# Patient Record
Sex: Female | Born: 1960 | Race: Black or African American | Hispanic: No | Marital: Single | Smoking: Never smoker
Health system: Southern US, Community
[De-identification: ages and names within clinical notes are randomized; demographics above are authoritative.]

## PROBLEM LIST (undated history)

## (undated) DIAGNOSIS — E119 Type 2 diabetes mellitus without complications: Secondary | ICD-10-CM

## (undated) HISTORY — PX: NO PAST SURGERIES: SHX2092

---

## 2018-09-07 DIAGNOSIS — E119 Type 2 diabetes mellitus without complications: Secondary | ICD-10-CM

## 2020-08-07 ENCOUNTER — Inpatient Hospital Stay (HOSPITAL_COMMUNITY)
Admission: EM | Admit: 2020-08-07 | Discharge: 2020-08-10 | DRG: 853 | Disposition: A | Discharge status: Home / Self Care | Payer: Self-pay | Attending: Internal Medicine | Admitting: Internal Medicine

## 2020-08-07 ENCOUNTER — Encounter (HOSPITAL_COMMUNITY): Payer: Self-pay | Admitting: Emergency Medicine

## 2020-08-07 ENCOUNTER — Ambulatory Visit (HOSPITAL_COMMUNITY)
Admission: RE | Admit: 2020-08-07 | Discharge: 2020-08-07 | Disposition: A | Discharge status: Home / Self Care | Payer: Self-pay | Source: Ambulatory Visit | Attending: Urgent Care | Admitting: Urgent Care

## 2020-08-07 ENCOUNTER — Other Ambulatory Visit: Payer: Self-pay

## 2020-08-07 ENCOUNTER — Other Ambulatory Visit: Payer: Self-pay | Admitting: Urgent Care

## 2020-08-07 ENCOUNTER — Other Ambulatory Visit (HOSPITAL_COMMUNITY): Payer: Self-pay | Admitting: Urgent Care

## 2020-08-07 ENCOUNTER — Emergency Department (HOSPITAL_COMMUNITY): Payer: Self-pay

## 2020-08-07 DIAGNOSIS — Z7984 Long term (current) use of oral hypoglycemic drugs: Secondary | ICD-10-CM

## 2020-08-07 DIAGNOSIS — K810 Acute cholecystitis: Secondary | ICD-10-CM | POA: Diagnosis present

## 2020-08-07 DIAGNOSIS — A409 Streptococcal sepsis, unspecified: Principal | ICD-10-CM | POA: Diagnosis present

## 2020-08-07 DIAGNOSIS — D649 Anemia, unspecified: Secondary | ICD-10-CM | POA: Diagnosis present

## 2020-08-07 DIAGNOSIS — Z5320 Procedure and treatment not carried out because of patient's decision for unspecified reasons: Secondary | ICD-10-CM

## 2020-08-07 DIAGNOSIS — E871 Hypo-osmolality and hyponatremia: Secondary | ICD-10-CM | POA: Diagnosis present

## 2020-08-07 DIAGNOSIS — E119 Type 2 diabetes mellitus without complications: Secondary | ICD-10-CM

## 2020-08-07 DIAGNOSIS — Z789 Other specified health status: Secondary | ICD-10-CM

## 2020-08-07 DIAGNOSIS — E785 Hyperlipidemia, unspecified: Secondary | ICD-10-CM

## 2020-08-07 DIAGNOSIS — K8000 Calculus of gallbladder with acute cholecystitis without obstruction: Secondary | ICD-10-CM

## 2020-08-07 DIAGNOSIS — K82A1 Gangrene of gallbladder in cholecystitis: Secondary | ICD-10-CM | POA: Diagnosis present

## 2020-08-07 DIAGNOSIS — Z20822 Contact with and (suspected) exposure to covid-19: Secondary | ICD-10-CM | POA: Diagnosis present

## 2020-08-07 DIAGNOSIS — K8012 Calculus of gallbladder with acute and chronic cholecystitis without obstruction: Secondary | ICD-10-CM | POA: Diagnosis present

## 2020-08-07 DIAGNOSIS — G9341 Metabolic encephalopathy: Secondary | ICD-10-CM | POA: Diagnosis present

## 2020-08-07 DIAGNOSIS — J9811 Atelectasis: Secondary | ICD-10-CM | POA: Diagnosis present

## 2020-08-07 DIAGNOSIS — E860 Dehydration: Secondary | ICD-10-CM | POA: Diagnosis present

## 2020-08-07 DIAGNOSIS — K819 Cholecystitis, unspecified: Secondary | ICD-10-CM

## 2020-08-07 DIAGNOSIS — R7989 Other specified abnormal findings of blood chemistry: Secondary | ICD-10-CM

## 2020-08-07 DIAGNOSIS — R1011 Right upper quadrant pain: Secondary | ICD-10-CM

## 2020-08-07 DIAGNOSIS — A419 Sepsis, unspecified organism: Secondary | ICD-10-CM

## 2020-08-07 DIAGNOSIS — K429 Umbilical hernia without obstruction or gangrene: Secondary | ICD-10-CM | POA: Diagnosis present

## 2020-08-07 DIAGNOSIS — D696 Thrombocytopenia, unspecified: Secondary | ICD-10-CM | POA: Diagnosis present

## 2020-08-07 DIAGNOSIS — R6520 Severe sepsis without septic shock: Secondary | ICD-10-CM | POA: Diagnosis present

## 2020-08-07 HISTORY — DX: Type 2 diabetes mellitus without complications: E11.9

## 2020-08-07 LAB — COMPREHENSIVE METABOLIC PANEL
ALT: 37 U/L (ref 0–44)
AST: 36 U/L (ref 15–41)
Albumin: 4 g/dL (ref 3.5–5.0)
Alkaline Phosphatase: 107 U/L (ref 38–126)
Anion gap: 12 (ref 5–15)
BUN: 21 mg/dL — ABNORMAL HIGH (ref 6–20)
CO2: 22 mmol/L (ref 22–32)
Calcium: 9.2 mg/dL (ref 8.9–10.3)
Chloride: 98 mmol/L (ref 98–111)
Creatinine, Ser: 0.86 mg/dL (ref 0.44–1.00)
GFR, Estimated: >60 mL/min (ref >60)
Glucose, Bld: 119 mg/dL — ABNORMAL HIGH (ref 70–99)
Potassium: 3.5 mmol/L (ref 3.5–5.1)
Sodium: 132 mmol/L — ABNORMAL LOW (ref 135–145)
Total Bilirubin: 2.8 mg/dL — ABNORMAL HIGH (ref 0.3–1.2)
Total Protein: 8.6 g/dL — ABNORMAL HIGH (ref 6.5–8.1)

## 2020-08-07 LAB — CBC
HCT: 39.9 % (ref 36.0–46.0)
Hemoglobin: 13.6 g/dL (ref 12.0–15.0)
MCH: 30.4 pg (ref 26.0–34.0)
MCHC: 34.1 g/dL (ref 30.0–36.0)
MCV: 89.1 fL (ref 80.0–100.0)
Platelets: 160 10*3/uL (ref 150–400)
RBC: 4.48 MIL/uL (ref 3.87–5.11)
RDW: 12.8 % (ref 11.5–15.5)
WBC: 16.6 10*3/uL — ABNORMAL HIGH (ref 4.0–10.5)
nRBC: 0 % (ref 0.0–0.2)

## 2020-08-07 LAB — CBG MONITORING, ED
Glucose-Capillary: 145 mg/dL — ABNORMAL HIGH (ref 70–99)
Glucose-Capillary: 94 mg/dL (ref 70–99)

## 2020-08-07 LAB — I-STAT BETA HCG BLOOD, ED (MC, WL, AP ONLY): I-stat hCG, quantitative: <5 m[IU]/mL (ref <5)

## 2020-08-07 LAB — RESP PANEL BY RT-PCR (FLU A&B, COVID) ARPGX2
Influenza A by PCR: NEGATIVE
Influenza B by PCR: NEGATIVE
SARS Coronavirus 2 by RT PCR: NEGATIVE

## 2020-08-07 LAB — PROCALCITONIN: Procalcitonin: 2.42 ng/mL

## 2020-08-07 LAB — CK: Total CK: 45 U/L (ref 38–234)

## 2020-08-07 LAB — PROTIME-INR
INR: 1.2 (ref 0.8–1.2)
Prothrombin Time: 14.3 seconds (ref 11.4–15.2)

## 2020-08-07 LAB — LIPASE, BLOOD: Lipase: 24 U/L (ref 11–51)

## 2020-08-07 LAB — APTT: aPTT: 23 seconds — ABNORMAL LOW (ref 24–36)

## 2020-08-07 LAB — MAGNESIUM: Magnesium: 2.3 mg/dL (ref 1.7–2.4)

## 2020-08-07 LAB — LACTIC ACID, PLASMA: Lactic Acid, Venous: 1.1 mmol/L (ref 0.5–1.9)

## 2020-08-07 MED ORDER — PIPERACILLIN-TAZOBACTAM 3.375 G IVPB: 3.3750 g | Freq: Three times a day (TID) | INTRAVENOUS | Status: DC

## 2020-08-07 MED ORDER — ONDANSETRON HCL 4 MG/2ML IJ SOLN: 4.0000 mg | Freq: Once | INTRAMUSCULAR | Status: DC

## 2020-08-07 MED ORDER — CHLORHEXIDINE GLUCONATE CLOTH 2 % EX PADS
6.0000 | MEDICATED_PAD | Freq: Once | CUTANEOUS | Status: AC
  Administered 2020-08-08: 6 via TOPICAL

## 2020-08-07 MED ORDER — ONDANSETRON HCL 4 MG/2ML IJ SOLN: 4.0000 mg | Freq: Once | INTRAMUSCULAR | Status: AC

## 2020-08-07 MED ORDER — GABAPENTIN 300 MG PO CAPS
300.0000 mg | ORAL_CAPSULE | ORAL | Status: AC
  Administered 2020-08-08: 300 mg via ORAL
  Filled 2020-08-07: qty 1

## 2020-08-07 MED ORDER — SODIUM CHLORIDE 0.9 % IV SOLN: 2.0000 g | Freq: Once | INTRAVENOUS | Status: DC

## 2020-08-07 MED ORDER — CELECOXIB 200 MG PO CAPS: 200.0000 mg | ORAL_CAPSULE | ORAL | Status: DC

## 2020-08-07 MED ORDER — BUPIVACAINE LIPOSOME 1.3 % IJ SUSP
20.0000 mL | Freq: Once | INTRAMUSCULAR | Status: DC
  Filled 2020-08-07: qty 20

## 2020-08-07 MED ORDER — FENTANYL CITRATE (PF) 100 MCG/2ML IJ SOLN: 50.0000 ug | Freq: Once | INTRAMUSCULAR | Status: DC | PRN

## 2020-08-07 MED ORDER — PIPERACILLIN-TAZOBACTAM 3.375 G IVPB
3.3750 g | Freq: Three times a day (TID) | INTRAVENOUS | Status: DC
  Administered 2020-08-08 (×2): 3.375 g via INTRAVENOUS
  Filled 2020-08-07 (×2): qty 50

## 2020-08-07 MED ORDER — LACTATED RINGERS IV BOLUS (SEPSIS)
500.0000 mL | Freq: Once | INTRAVENOUS | Status: AC
  Administered 2020-08-07: 500 mL via INTRAVENOUS

## 2020-08-07 MED ORDER — LACTATED RINGERS IV SOLN: INTRAVENOUS | Status: AC

## 2020-08-07 MED ORDER — LACTATED RINGERS IV BOLUS
1000.0000 mL | Freq: Once | INTRAVENOUS | Status: AC
  Administered 2020-08-07: 1000 mL via INTRAVENOUS

## 2020-08-07 MED ORDER — INSULIN ASPART 100 UNIT/ML ~~LOC~~ SOLN
0.0000 [IU] | SUBCUTANEOUS | Status: DC
  Administered 2020-08-08 (×2): 1 [IU] via SUBCUTANEOUS
  Administered 2020-08-08 (×2): 2 [IU] via SUBCUTANEOUS
  Administered 2020-08-09 (×2): 1 [IU] via SUBCUTANEOUS
  Filled 2020-08-07: qty 0.09

## 2020-08-07 MED ORDER — SODIUM CHLORIDE 0.9 % IV BOLUS
500.0000 mL | Freq: Once | INTRAVENOUS | Status: AC
  Administered 2020-08-07: 500 mL via INTRAVENOUS

## 2020-08-07 MED ORDER — CHLORHEXIDINE GLUCONATE CLOTH 2 % EX PADS: 6.0000 | MEDICATED_PAD | Freq: Once | CUTANEOUS | Status: DC

## 2020-08-07 MED ORDER — PIPERACILLIN-TAZOBACTAM 3.375 G IVPB 30 MIN
3.3750 g | Freq: Once | INTRAVENOUS | Status: AC
  Administered 2020-08-07: 3.375 g via INTRAVENOUS
  Filled 2020-08-07: qty 50

## 2020-08-07 MED ORDER — ACETAMINOPHEN 500 MG PO TABS
1000.0000 mg | ORAL_TABLET | ORAL | Status: AC
  Filled 2020-08-07: qty 2

## 2020-08-07 MED ORDER — ONDANSETRON HCL 4 MG/2ML IJ SOLN
INTRAMUSCULAR | Status: AC
  Administered 2020-08-07: 4 mg via INTRAVENOUS
  Filled 2020-08-07: qty 2

## 2020-08-07 MED ORDER — ACETAMINOPHEN 325 MG PO TABS
650.0000 mg | ORAL_TABLET | Freq: Once | ORAL | Status: AC | PRN
  Administered 2020-08-07: 650 mg via ORAL
  Filled 2020-08-07: qty 2

## 2020-08-07 NOTE — Consult Note (Addendum)
Bethany Diaz  November 14, 1960 [id number]  CARE TEAM:  PCP: Patient, No Pcp Per  Outpatient Care Team: Patient Care Team: Patient, No Pcp Per as PCP - General (General Practice)  Inpatient Treatment Team: Treatment Team: Attending Provider: Geoffery Lyons, MD; Technician: Ranell Patrick, NT; Registered Nurse: Gasper Lloyd, RN; Physician Assistant: Norman Clay; Consulting Physician: Montez Morita, Md, MD   This patient is a 59 y.o.female who presents today for surgical evaluation at the request of Marlaine Hind, [place] &  Dr Judd Lien. [facility]  Chief complaint / Reason for evaluation: Gallbladder attack, probable cholecystitis  Diabetic woman.  Does not speak [place] - prefers [place].  I believe she was originally from [place] and then in [place].  Had worsening abdominal pain and discomfort.  Discussed with her primary care office.  They recommended ultrasound and labs.  Elevated liver function tests and ultrasound suspicious for cholecystitis.  Sounds like she was told to go to the emergency room.  Surgical consultation recommended.  It was a challenge to find an appropriate translator since she speaks [place].  However after the fourth attempt, we got an interpreter who seemed to be able to discuss with the patient well and the patient felt comfortable communicating with.  Patient claims pain in the right upper side lasting for several days.  She tries to avoid fatty or sweet foods and she does have diabetes.  It has been recommended that she be on Metformin.  I do not know if she is taking it but she notes she does have the prescription.  She has a primary care physician who she last saw about 2 years ago.  I do not think she has had 6 months follow-ups as had been recommended.  Patient denies any history abdominal surgery.  No cardiac or pulmonary issues.  No history of heart attack or stroke.  Claims to move her bowels about every day or so.  No liver problems.  No heavy  drinking.  No pancreas problems.  No history of ulcers or heartburn or reflux issues.  Patient notes the pain became much more intense.  Sounds like she has had some nausea but no definite emesis.  Appetite is way down.  Ultrasound done concerning for cholecystitis.  No personal nor family history of GI/colon cancer, inflammatory bowel disease, irritable bowel syndrome, allergy such as Celiac Sprue, dietary/dairy problems, colitis, ulcers nor gastritis.  Denies any history of liver problems nor hepatitis.  No recent sick contacts/gastroenteritis.  No travel outside the country.  No changes in diet.  No dysphagia to solids or liquids.  No significant heartburn or reflux.  No hematochezia, hematemesis, coffee ground emesis.  No evidence of prior gastric/peptic ulceration.  Patient does not want to consider any surgery.   Assessment  Bethany Diaz  59 y.o. female       Problem List:  Principal Problem:   Acute calculous cholecystitis Active Problems:   Type 2 diabetes mellitus without complication, without long-term current use of insulin (HCC)   Non-English speaking patient ([place] preferred)   Acute cholecystitis by history physical and ultrasound exam.  Gallbladder wall thickening and Murphy sign.  Elevated bilirubin liver function test.  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    Plan:  I explained through the interpreter that she had a rather classic picture of cholecystitis.  I recommended hospitalization, IV antibiotics, laparoscopic cholecystectomy in the morning.  We worked to try and explain the pathophysiology  and reasoning with the interpreter using lay language.  I explained many different ways.  Spent over 20 minutes discussing with her.  Patient stated that she did not want to be admitted nor did she want to consider any surgery.  She felt she just needed to get antibiotics through her primary care office and go home.  I tried to discourage her from  this.  She said she did not want to have surgery, and I should call her son.  I d/w Dr. Judd Lien, emergency room physician that I was going to discuss with the son since the patient is refusing surgery.  I called her son, Randel Books, [phone].  I explained the diagnosis and my recommendations.  He had questions about risks of bleeding and surgical technique.  I explained a laparoscopic approach.  I noted the risk of major bleeding or need for transfusion should be rather low.  I explained to both of them that there are risks of surgery but I think the risks in her relatively low in the risk of doing antibiotic only uses failure rate and imminent need for cholecystectomy anyway.  Patient's son will try to come to the hospital and discuss with his mother again.  I called and discussed with the covering [facility] ED physician assistant, Marlaine Hind.  Explained the reasoning for hospitalization cholecystectomy.  I explained the natural history risk of going against medical/surgical advice with increasing concerns for progressive infection, gangrene/empyema, cholangitis, nausea vomiting dehydration, and perhaps even rupture and biliary peritonitis.  My concern of delay is that it gets much more difficult to do cholecystectomy surgically, requiring percutaneous duration or conversion to open.  Bile duct injury risks go way up as well the more we delay her if she gets repeated problems.  I do not think these risks are imminent tonight.  However, I am rather skeptical that antibiotics only will solve this problem, just perhaps delay it a little bit.  The need for surgery is imminent.  Because the patient is refusing my surgical advice, surgery will follow peripherally.  Should the patient be able to convinced to be admitted, medicine can reconsult surgery in the morning to see if the patient is more willing to reconsider surgery.  I would recommend IV Zosyn since it looks like she has significant cholecystitis  with leukocytosis.  That has already started.  Should she be admitted, I would recommend follow-up LFTs in the morning.  If they are significantly increased may need to involve gastroenterology with MRCP/ERCP to rule out common bile duct stones.  If not more significantly elevated, perhaps proceed with cholecystectomy first if patient reconsiders.  She does not appear to be hyperglycemic, but I am concerned that she is not too compliant on her diabetic medicine.  Check hemoglobin A1c if she stays.  See what medicine thinks.  -VTE prophylaxis- SCDs, etc -mobilize as tolerated to help recovery  55 minutes spent in review, evaluation, examination, counseling, and coordination of care.  More than 50% of that time was spent in counseling.  Ardeth Sportsman, MD, FACS, MASCRS Gastrointestinal and Minimally Invasive Surgery  [facility] Surgery [address] [place], [place] [address] [phone] Fax [phone] Main/Paging  CONTACT INFORMATION: Weekday (9AM-5PM) concerns: Call [facility] main office at [phone] Weeknight (5PM-9AM) or Weekend/Holiday concerns: Check [email or web address] for General Surgery [facility] coverage (Please, do not use SecureChat as it is not reliable communication to operating surgeons for immediate patient care)      08/07/2020  Past Medical History:  Diagnosis Date  . Diabetes mellitus without complication [facility])     Past Surgical History:  Procedure Laterality Date  . NO PAST SURGERIES      Social History   Socioeconomic History  . Marital status: Single    Spouse name: Not on file  . Number of children: Not on file  . Years of education: Not on file  . Highest education level: Not on file  Occupational History  . Not on file  Tobacco Use  . Smoking status: Never Smoker  Substance and Sexual Activity  . Alcohol use: Never  . Drug use: Never  . Sexual activity: Not on file  Other Topics Concern  . Not on file  Social  History Narrative  . Not on file   Social Determinants of Health   Financial Resource Strain:   . Difficulty of Paying Living Expenses: Not on file  Food Insecurity:   . Worried About Programme researcher, broadcasting/film/video in the Last Year: Not on file  . Ran Out of Food in the Last Year: Not on file  Transportation Needs:   . Lack of Transportation (Medical): Not on file  . Lack of Transportation (Non-Medical): Not on file  Physical Activity:   . Days of Exercise per Week: Not on file  . Minutes of Exercise per Session: Not on file  Stress:   . Feeling of Stress : Not on file  Social Connections:   . Frequency of Communication with Friends and Family: Not on file  . Frequency of Social Gatherings with Friends and Family: Not on file  . Attends Religious Services: Not on file  . Active Member of Clubs or Organizations: Not on file  . Attends Banker Meetings: Not on file  . Marital Status: Not on file  Intimate Partner Violence:   . Fear of Current or Ex-Partner: Not on file  . Emotionally Abused: Not on file  . Physically Abused: Not on file  . Sexually Abused: Not on file    No family history on file.  Current Facility-Administered Medications  Medication Dose Route Frequency Provider Last Rate Last Admin  . fentaNYL (SUBLIMAZE) injection 50 mcg  50 mcg Intravenous Once PRN Cristina Gong, PA-C      . lactated ringers infusion   Intravenous Continuous Cristina Gong, PA-C 150 mL/hr at 08/07/20 1830 New Bag at 08/07/20 1830  . ondansetron (ZOFRAN) injection 4 mg  4 mg Intravenous Once Cristina Gong, PA-C       No current outpatient medications on file.     No Known Allergies  ROS:   All other systems reviewed & are negative except per HPI or as noted below: Constitutional:  No fevers, chills, sweats.  Weight stable Eyes:  No vision changes, No discharge HENT:  No sore throats, nasal drainage Lymph: No neck swelling, No bruising easily Pulmonary:  No  cough, productive sputum CV: No orthopnea, PND  Patient walks 30 minutes for about 1 miles without difficulty.  No exertional chest/neck/shoulder/arm pain. GI:  No personal nor family history of GI/colon cancer, inflammatory bowel disease, irritable bowel syndrome, allergy such as Celiac Sprue, dietary/dairy problems, colitis, ulcers nor gastritis.  No recent sick contacts/gastroenteritis.  No travel outside the country.  No changes in diet. Renal: No UTIs, No hematuria Genital:  No drainage, bleeding, masses Musculoskeletal: No severe joint pain.  Good ROM major joints Skin:  No sores or lesions.  No rashes Heme/Lymph:  No  easy bleeding.  No swollen lymph nodes Neuro: No focal weakness/numbness.  No seizures Psych: No suicidal ideation.  No hallucinations  BP (!) 141/108   Pulse 88   Temp 99.1 F (37.3 C) (Oral)   Resp (!) 26   SpO2 93%   Physical Exam: Constitutional: Not cachectic.  Hygeine adequate.  Vitals signs as above.  Patient not toxic nor sickly.  Somewhat fidgety in bed but not agitated nor psychotic Eyes: Pupils reactive, normal extraocular movements. Sclera nonicteric Neuro: CN II-XII intact.  No major focal sensory defects.  No major motor deficits. Lymph: No head/neck/groin lymphadenopathy Psych:  No severe agitation.  No severe anxiety.  Judgment & insight Adequate, Oriented x4, HENT: Normocephalic, Mucus membranes moist.  No thrush.   Neck: Supple, No tracheal deviation.  No obvious thyromegaly Chest: No pain to chest wall compression.  Good respiratory excursion.  No audible wheezing CV:  Pulses intact.  Regular rhythm.  No major extremity edema Abdomen:  Soft.  Mildly distended.  Tenderness at Right upper quadrant subcostal ridge only with Murphy sign.  The rest of the abdomen is nontender.. No incarcerated hernias.  No hepatomegaly.  No splenomegaly Gen:  No inguinal hernias.  No inguinal lymphadenopathy.   Ext: No obvious deformity or contracture no significant  edema.  No cyanosis Skin: No major subcutaneous nodules.  Warm and dry Musculoskeletal: Severe joint rigidity not present.  No obvious clubbing.  No digital petechiae.     Results:   Labs: Results for orders placed or performed during the hospital encounter of 08/07/20 (from the past 48 hour(s))  Lipase, blood     Status: None   Collection Time: 08/07/20  4:07 PM  Result Value Ref Range   Lipase 24 11 - 51 U/L    Comment: Performed at [facility], [address], [place], [place] [address]  Comprehensive metabolic panel     Status: Abnormal   Collection Time: 08/07/20  4:07 PM  Result Value Ref Range   Sodium 132 (L) 135 - 145 mmol/L   Potassium 3.5 3.5 - 5.1 mmol/L   Chloride 98 98 - 111 mmol/L   CO2 22 22 - 32 mmol/L   Glucose, Bld 119 (H) 70 - 99 mg/dL    Comment: Glucose reference range applies only to samples taken after fasting for at least 8 hours.   BUN 21 (H) 6 - 20 mg/dL   Creatinine, Ser 8.92 0.44 - 1.00 mg/dL   Calcium 9.2 8.9 - 11.9 mg/dL   Total Protein 8.6 (H) 6.5 - 8.1 g/dL   Albumin 4.0 3.5 - 5.0 g/dL   AST 36 15 - 41 U/L   ALT 37 0 - 44 U/L   Alkaline Phosphatase 107 38 - 126 U/L   Total Bilirubin 2.8 (H) 0.3 - 1.2 mg/dL   GFR, Estimated >41 >74 mL/min    Comment: (NOTE) Calculated using the CKD-EPI Creatinine Equation (2021)    Anion gap 12 5 - 15    Comment: Performed at [facility], [address] [place], [place] [address]  CBC     Status: Abnormal   Collection Time: 08/07/20  4:07 PM  Result Value Ref Range   WBC 16.6 (H) 4.0 - 10.5 K/uL   RBC 4.48 3.87 - 5.11 MIL/uL   Hemoglobin 13.6 12.0 - 15.0 g/dL   HCT 81.8 36 - 46 %   MCV 89.1 80.0 - 100.0 fL   MCH 30.4 26.0 - 34.0 pg  MCHC 34.1 30.0 - 36.0 g/dL   RDW 16.112.8 09.611.5 - 04.515.5 %   Platelets 160 150 - 400 K/uL   nRBC 0.0 0.0 - 0.2 %    Comment: Performed at [facility], [address] [place], [place] [address]  Lactic acid, plasma      Status: None   Collection Time: 08/07/20  4:31 PM  Result Value Ref Range   Lactic Acid, Venous 1.1 0.5 - 1.9 mmol/L    Comment: Performed at [facility], [address] [place], [place] [address]  Protime-INR     Status: None   Collection Time: 08/07/20  4:45 PM  Result Value Ref Range   Prothrombin Time 14.3 11.4 - 15.2 seconds   INR 1.2 0.8 - 1.2    Comment: (NOTE) INR goal varies based on device and disease states. Performed at [facility], [address] [place], [place] [address]   APTT     Status: Abnormal   Collection Time: 08/07/20  4:45 PM  Result Value Ref Range   aPTT 23 (L) 24 - 36 seconds    Comment: Performed at [facility], [address] [place], [place] [address]  Resp Panel by RT-PCR (Flu A&B, Covid) Nasopharyngeal Swab     Status: None   Collection Time: 08/07/20  4:47 PM   Specimen: Nasopharyngeal Swab; Nasopharyngeal(NP) swabs in vial transport medium  Result Value Ref Range   SARS Coronavirus 2 by RT PCR NEGATIVE NEGATIVE    Comment: (NOTE) SARS-CoV-2 target nucleic acids are NOT DETECTED.  The SARS-CoV-2 RNA is generally detectable in upper respiratory specimens during the acute phase of infection. The lowest concentration of SARS-CoV-2 viral copies this assay can detect is 138 copies/mL. A negative result does not preclude SARS-Cov-2 infection and should not be used as the sole basis for treatment or other patient management decisions. A negative result may occur with  improper specimen collection/handling, submission of specimen other than nasopharyngeal swab, presence of viral mutation(s) within the areas targeted by this assay, and inadequate number of viral copies(<138 copies/mL). A negative result must be combined with clinical observations, patient history, and epidemiological information. The expected result is Negative.  Fact Sheet for Patients:   BloggerCourse.comhttps://www.fda.gov/media/152166/download  Fact Sheet for Healthcare Providers:  SeriousBroker.ithttps://www.fda.gov/media/152162/download  This test is no t yet approved or cleared by the [place] FDA and  has been authorized for detection and/or diagnosis of SARS-CoV-2 by FDA under an Emergency Use Authorization (EUA). This EUA will remain  in effect (meaning this test can be used) for the duration of the COVID-19 declaration under Section 564(b)(1) of the Act, 21 U.S.C.section 360bbb-3(b)(1), unless the authorization is terminated  or revoked sooner.       Influenza A by PCR NEGATIVE NEGATIVE   Influenza B by PCR NEGATIVE NEGATIVE    Comment: (NOTE) The Xpert Xpress SARS-CoV-2/FLU/RSV plus assay is intended as an aid in the diagnosis of influenza from Nasopharyngeal swab specimens and should not be used as a sole basis for treatment. Nasal washings and aspirates are unacceptable for Xpert Xpress SARS-CoV-2/FLU/RSV testing.  Fact Sheet for Patients: BloggerCourse.comhttps://www.fda.gov/media/152166/download  Fact Sheet for Healthcare Providers: SeriousBroker.ithttps://www.fda.gov/media/152162/download  This test is not yet approved or cleared by the [place] FDA and has been authorized for detection and/or diagnosis of SARS-CoV-2 by FDA under an Emergency Use Authorization (EUA). This EUA will remain in effect (meaning this test can be used) for the duration of the COVID-19 declaration  under Section 564(b)(1) of the Act, 21 U.S.C. section 360bbb-3(b)(1), unless the authorization is terminated or revoked.  Performed at [facility], [address], [place], [place] [address]   I-Stat beta hCG blood, ED     Status: None   Collection Time: 08/07/20  5:06 PM  Result Value Ref Range   I-stat hCG, quantitative <5.0 <5 mIU/mL   Comment 3            Comment:   GEST. AGE      CONC.  (mIU/mL)   <=1 WEEK        5 - 50     2 WEEKS       50 - 500     3 WEEKS       100 - 10,000     4 WEEKS      1,000 - 30,000        FEMALE AND NON-PREGNANT FEMALE:     LESS THAN 5 mIU/mL     Imaging / Studies: DG Chest Port 1 View  Result Date: 08/07/2020 CLINICAL DATA:  Questionable sepsis, evaluate for abnormality. EXAM: PORTABLE CHEST 1 VIEW COMPARISON:  None. FINDINGS: Borderline enlargement the cardiac silhouette. Linear opacity at the lateral right lung base. Otherwise, no consolidation. No visible pleural effusions or pneumothorax. No acute osseous abnormality. IMPRESSION: Linear opacity at the lateral right lung base, favor atelectasis. Electronically Signed   By: Feliberto Harts MD   On: 08/07/2020 18:36   US Abdomen Limited RUQ (LIVER/GB)  Result Date: 08/07/2020 CLINICAL DATA:  Right upper quadrant abdominal pain. EXAM: ULTRASOUND ABDOMEN LIMITED RIGHT UPPER QUADRANT COMPARISON:  None. FINDINGS: Gallbladder: Gallbladder wall thickening. Positive Murphy sign per the technologist. Multiple gallstones, measuring up to 1.2 cm. There may be a small amount of pericholecystic fluid. Common bile duct: Diameter: 2.7 mm, within normal limits. Liver: No focal lesion identified. Within normal limits in parenchymal echogenicity. Portal vein is patent on color Doppler imaging with normal direction of blood flow towards the liver. IMPRESSION: Findings concerning for acute cholecystitis, including gallbladder wall thickening, gallstones, and positive Murphy sign. These results will be called to the ordering clinician or representative by the Radiologist Assistant, and communication documented in the PACS or [employer]. Electronically Signed   By: Feliberto Harts MD   On: 08/07/2020 15:39    Medications / Allergies: per chart  Antibiotics: Anti-infectives (From admission, onward)   Start     Dose/Rate Route Frequency Ordered Stop   08/07/20 1715  piperacillin-tazobactam (ZOSYN) IVPB 3.375 g        3.375 g 100 mL/hr over 30 Minutes Intravenous  Once 08/07/20 1705 08/07/20 1750   08/07/20 1700   cefTRIAXone (ROCEPHIN) 2 g in sodium chloride 0.9 % 100 mL IVPB  Status:  Discontinued        2 g 200 mL/hr over 30 Minutes Intravenous  Once 08/07/20 1647 08/07/20 1705        Note: Portions of this report may have been transcribed using voice recognition software. Every effort was made to ensure accuracy; however, inadvertent computerized transcription errors may be present.   Any transcriptional errors that result from this process are unintentional.    Ardeth Sportsman, MD, FACS, MASCRS Gastrointestinal and Minimally Invasive Surgery  [facility] Surgery [address] [place], [place] [address] [phone] Fax [phone] Main/Paging  CONTACT INFORMATION: Weekday (9AM-5PM) concerns: Call [facility] main office at [phone] Weeknight (5PM-9AM) or Weekend/Holiday concerns: Check [email or web address] for  General Surgery [facility] coverage (Please, do not use SecureChat as it is not reliable communication to operating surgeons for immediate patient care)      08/07/2020  6:49 PM

## 2020-08-07 NOTE — ED Triage Notes (Signed)
Pt presents from PCP after having R sided upper  abdominal pain. Pt was sent to our US imaging here in the hospital and now has been diagnosed with acute cholecystitis. Alert and oriented. Speaks french creole/haitian creole.   Son, Fayrene Fearing, wants to be called with any information. [phone]

## 2020-08-07 NOTE — Progress Notes (Signed)
A consult was received from an ED physician for zosyn  per pharmacy dosing.  The patient's profile has been reviewed for ht/wt/allergies/indication/available labs.    A one time order has been placed for zosyn 3.375 gm IV infuse over 30 min x1.  Further antibiotics/pharmacy consults should be ordered by admitting physician if indicated.                       Thank you, Lucia Gaskins 08/07/2020  4:53 PM

## 2020-08-07 NOTE — H&P (Signed)
[name] XLK:[id number] DOB: October 30, 1960 DOA: 08/07/2020    PCP: Patient, No Pcp Per   Outpatient Specialists:  NONE    Patient arrived to ER on 08/07/20 at 1513 Referred by Attending Veryl Speak, MD   Patient coming from: home Lives  With family    Chief Complaint:   Chief Complaint  Patient presents with  . Abdominal Pain    HPI: Bethany Diaz is a 59 y.o. female with medical history significant of  DM2, HLD    Presented with   Abdominal pain for the past 2 days unable to eat Nausea and vomiting and fever.  She takes metformin for DM not on any insulin Also report tooth ache that has been for the past 5 days  never smoker does not drink At baseline able to walk a flight of stairs Has no exertional chest pain or chest pain otherwise no dyspnea on exertion  No known hx of CAD Infectious risk factors:  Reports  fever,  N/V/Diarrhea/abdominal pain,     Has NOt been vaccinated against COVID    Initial COVID TEST  NEGATIVE   Lab Results  Component Value Date   [place] 08/07/2020      Regarding pertinent Chronic problems    DM 2 - No results found for: HGBA1C  on   PO meds only    While in ER: US showing cholecystitis Patient is [place] speaking only Son arrives and was able to help with translation ER provider was obtained history using tele translator Given evidence of cholecystitis patient started on Zosyn  ER Provider Called: general Surgery    Dr. Johney Maine  Initially patient refused cholecystectomy and wanted to be discharged to home After extensive discussion with patient and the family she agreed to stay and have surgical procedure done in the morning General surgery is aware  seen in ER originally Plan to see in a.m. again Please see Dr. Johney Maine note  Hospitalist was called for admission for Cholecystitis resulting in sepsis  The following Work up has been ordered so far:  Orders Placed This Encounter  Procedures  .  Critical Care  . Culture, blood (routine x 2)  . Urine culture  . Resp Panel by RT-PCR (Flu A&B, Covid) Nasopharyngeal Swab  . DG Chest Port 1 View  . Lipase, blood  . Comprehensive metabolic panel  . CBC  . Urinalysis, Routine w reflex microscopic  . Lactic acid, plasma  . Protime-INR  . APTT  . Hemoglobin A1c  . Creatinine, serum  . Potassium  . Hepatic function panel  . Lipase, blood  . CBC  . Diet NPO time specified  . Cardiac monitoring  . Document height and weight  . Assess and Document Glasgow Coma Scale  . Document vital signs within 1-hour of fluid bolus completion. Notify provider of abnormal vital signs despite fluid resuscitation.  . DO NOT delay antibiotics if unable to obtain blood culture.  . Refer to Sidebar Report: Sepsis Sidebar ED/IP  . Notify provider for difficulties obtaining IV access.  . Insert peripheral IV x 2  . Initiate Carrier Fluid Protocol  . Height and weight  . Check Rectal Temperature  . Code Sepsis activation.  This occurs automatically when order is signed and prioritizes pharmacy, lab, and radiology services for STAT collections and interventions.  If [facility] downtime, call [facility] [phone]) to activate Code Sepsis.  . Consult to general surgery  ALL PATIENTS BEING ADMITTED/HAVING PROCEDURES NEED COVID-19 SCREENING  .  Consult to hospitalist  ALL PATIENTS BEING ADMITTED/HAVING PROCEDURES NEED COVID-19 SCREENING  . Pulse oximetry, continuous  . I-Stat beta hCG blood, ED  . CBG monitoring, ED  . ED EKG     Following Medications were ordered in ER: Medications  lactated ringers infusion ( Intravenous New Bag/Given 08/07/20 1830)  ondansetron (ZOFRAN) injection 4 mg (0 mg Intravenous Hold 08/07/20 1752)  fentaNYL (SUBLIMAZE) injection 50 mcg (has no administration in time range)  sodium chloride 0.9 % bolus 500 mL (has no administration in time range)  acetaminophen (TYLENOL) tablet 650 mg (650 mg Oral Given 08/07/20 1613)  lactated  ringers bolus 500 mL (0 mLs Intravenous Stopped 08/07/20 1830)  piperacillin-tazobactam (ZOSYN) IVPB 3.375 g ( Intravenous Stopped 08/07/20 1750)  ondansetron (ZOFRAN) injection 4 mg (4 mg Intravenous Given 08/07/20 1735)  lactated ringers bolus 1,000 mL (1,000 mLs Intravenous New Bag/Given 08/07/20 2023)        Consult Orders  (From admission, onward)         Start     Ordered   08/07/20 2038  Consult to hospitalist  ALL PATIENTS BEING ADMITTED/HAVING PROCEDURES NEED COVID-19 SCREENING  Once       Comments: ALL PATIENTS BEING ADMITTED/HAVING PROCEDURES NEED COVID-19 SCREENING  Provider:  (Not yet assigned)  Question Answer Comment  Place call to: [facility]   Reason for Consult Admit      08/07/20 2037          Significant initial  Findings: Abnormal Labs Reviewed  COMPREHENSIVE METABOLIC PANEL - Abnormal; Notable for the following components:      Result Value   Sodium 132 (*)    Glucose, Bld 119 (*)    BUN 21 (*)    Total Protein 8.6 (*)    Total Bilirubin 2.8 (*)    All other components within normal limits  CBC - Abnormal; Notable for the following components:   WBC 16.6 (*)    All other components within normal limits  APTT - Abnormal; Notable for the following components:   aPTT 23 (*)    All other components within normal limits   Otherwise labs showing:    Recent Labs  Lab 08/07/20 1607  NA 132*  K 3.5  CO2 22  GLUCOSE 119*  BUN 21*  CREATININE 0.86  CALCIUM 9.2    Cr  stable,    Lab Results  Component Value Date   CREATININE 0.86 08/07/2020    Recent Labs  Lab 08/07/20 1607  AST 36  ALT 37  ALKPHOS 107  BILITOT 2.8*  PROT 8.6*  ALBUMIN 4.0   Lab Results  Component Value Date   CALCIUM 9.2 08/07/2020   WBC      Component Value Date/Time   WBC 16.6 (H) 08/07/2020 1607    Plt: Lab Results  Component Value Date   PLT 160 08/07/2020    Lactic Acid, Venous    Component Value Date/Time   LATICACIDVEN 1.1 08/07/2020 1631      Procalcitonin 2.42    HG/HCT stable,     Component Value Date/Time   HGB 13.6 08/07/2020 1607   HCT 39.9 08/07/2020 1607   MCV 89.1 08/07/2020 1607    Recent Labs  Lab 08/07/20 1607  LIPASE 24   No results for input(s): AMMONIA in the last 168 hours.      Cardiac Panel (last 3 results) Recent Labs    08/07/20 1607  CKTOTAL 45       ECG: Ordered Personally  reviewed by me showing: HR : 96 Rhythm:  NSR   no evidence of ischemic changes QTC 426   DM  labs:  HbA1C: No results for input(s): HGBA1C in the last 8760 hours.     CBG (last 3)  Recent Labs    08/07/20 2028  GLUCAP 94      UA   ordered       CXR - atelectasis   [place] - cholesytitis    ED Triage Vitals  Enc Vitals Group     BP 08/07/20 1600 114/85     Pulse Rate 08/07/20 1600 (!) 111     Resp 08/07/20 1600 16     Temp 08/07/20 1600 (!) 101.8 F (38.8 C)     Temp Source 08/07/20 1600 Oral     SpO2 08/07/20 1600 94 %     Weight 08/07/20 2019 137 lb (62.1 kg)     Height --      Head Circumference --      Peak Flow --      Pain Score 08/07/20 1602 5     Pain Loc --      Pain Edu? --      Excl. in [place]? --   TMAX(24)@       Latest  Blood pressure 98/80, pulse 98, temperature 100.2 F (37.9 C), temperature source Rectal, resp. rate 19, weight 62.1 kg, SpO2 99 %.      Review of Systems:    Pertinent positives include:   Fevers, chills, fatigue, abdominal pain, nausea, vomiting,  Constitutional:  No weight loss, night sweats, weight loss  HEENT:  No headaches, Difficulty swallowing,Tooth/dental problems,Sore throat,  No sneezing, itching, ear ache, nasal congestion, post nasal drip,  Cardio-vascular:  No chest pain, Orthopnea, PND, anasarca, dizziness, palpitations.no Bilateral lower extremity swelling  GI:  No heartburn, indigestion,  diarrhea, change in bowel habits, loss of appetite, melena, blood in stool, hematemesis Resp:  no shortness of breath at rest. No dyspnea on  exertion, No excess mucus, no productive cough, No non-productive cough, No coughing up of blood.No change in color of mucus.No wheezing. Skin:  no rash or lesions. No jaundice GU:  no dysuria, change in color of urine, no urgency or frequency. No straining to urinate.  No flank pain.  Musculoskeletal:  No joint pain or no joint swelling. No decreased range of motion. No back pain.  Psych:  No change in mood or affect. No depression or anxiety. No memory loss.  Neuro: no localizing neurological complaints, no tingling, no weakness, no double vision, no gait abnormality, no slurred speech, no confusion  All systems reviewed and apart from [place] all are negative  Past Medical History:   Past Medical History:  Diagnosis Date  . Diabetes mellitus without complication [facility])       Past Surgical History:  Procedure Laterality Date  . NO PAST SURGERIES      Social History:  Ambulatory  Independently      reports that she has never smoked. She has never used smokeless tobacco. She reports that she does not drink alcohol and does not use drugs.   Family History:   Family History  Problem Relation Age of Onset  . Diabetes Neg Hx     Allergies: Allergies  Allergen Reactions  . Other     Pt states butter, oil and salt makes her feet sweat and itch on the bottom and her toes tender via [place] interpreter.     Prior to  Admission medications   Medication Sig Start Date End Date Taking? Authorizing Provider  acetaminophen (TYLENOL) 500 MG tablet Take 1,000 mg by mouth every 6 (six) hours as needed for moderate pain.   Yes [provider]  metFORMIN (GLUCOPHAGE) 500 MG tablet Take 500 mg by mouth 2 (two) times daily with a meal.  10/10/19  Yes [provider]   Physical Exam: Vitals with BMI 08/07/2020 08/07/2020 08/07/2020  Weight - 440 lbs -  Systolic 98 - 81  Diastolic 80 - 59  Pulse 98 - 89     1. General:  in No  Acute distress   acutely ill  -appearing 2. Psychological: somnolent not     Oriented 3. Head/ENT:    Dry Mucous Membranes                          Head Non traumatic, neck supple                            Poor Dentition 4. SKIN:  decreased Skin turgor,  Skin clean Dry and intact no rash 5. Heart: Regular rate and rhythm no  Murmur, no Rub or gallop 6. Lungs:  no wheezes or crackles   7. Abdomen:  RUQ tender, Non distended bowel sounds present 8. Lower extremities: no clubbing, cyanosis, no  edema 9. Neurologically Grossly intact, moving all 4 extremities equally   10. MSK: Normal range of motion   All other LABS:     Recent Labs  Lab 08/07/20 1607  WBC 16.6*  HGB 13.6  HCT 39.9  MCV 89.1  PLT 160     Recent Labs  Lab 08/07/20 1607  NA 132*  K 3.5  CL 98  CO2 22  GLUCOSE 119*  BUN 21*  CREATININE 0.86  CALCIUM 9.2     Recent Labs  Lab 08/07/20 1607  AST 36  ALT 37  ALKPHOS 107  BILITOT 2.8*  PROT 8.6*  ALBUMIN 4.0      Cultures:    Component Value Date/Time   SDES  08/07/2020 1634    BLOOD LEFT FOREARM Performed at [facility], [place] [address] [place], [place] [address]    [facility]  08/07/2020 1634    BOTTLES DRAWN AEROBIC AND ANAEROBIC Blood Culture adequate volume Performed at [facility] [address] [place], [place] [address]    CULT PENDING 08/07/2020 4034   REPTSTATUS PENDING 08/07/2020 1634     Radiological Exams on Admission: DG Chest Port 1 View  Result Date: 08/07/2020 CLINICAL DATA:  Questionable sepsis, evaluate for abnormality. EXAM: PORTABLE CHEST 1 VIEW COMPARISON:  None. FINDINGS: Borderline enlargement the cardiac silhouette. Linear opacity at the lateral right lung base. Otherwise, no consolidation. No visible pleural effusions or pneumothorax. No acute osseous abnormality. IMPRESSION: Linear opacity at the lateral right lung base, favor atelectasis. Electronically Signed   By: Margaretha Sheffield MD   On: 08/07/2020 18:36    US Abdomen Limited RUQ (LIVER/GB)  Result Date: 08/07/2020 CLINICAL DATA:  Right upper quadrant abdominal pain. EXAM: ULTRASOUND ABDOMEN LIMITED RIGHT UPPER QUADRANT COMPARISON:  None. FINDINGS: Gallbladder: Gallbladder wall thickening. Positive Murphy sign per the technologist. Multiple gallstones, measuring up to 1.2 cm. There may be a small amount of pericholecystic fluid. Common bile duct: Diameter: 2.7 mm, within normal limits. Liver: No focal lesion identified. Within normal limits in parenchymal echogenicity. Portal vein is  patent on color Doppler imaging with normal direction of blood flow towards the liver. IMPRESSION: Findings concerning for acute cholecystitis, including gallbladder wall thickening, gallstones, and positive Murphy sign. These results will be called to the ordering clinician or representative by the Radiologist Assistant, and communication documented in the PACS or [employer]. Electronically Signed   By: Margaretha Sheffield MD   On: 08/07/2020 15:39    Chart has been reviewed    Assessment/Plan   59 y.o. female with medical history significant of  DM2, HLD     Admitted for sepsis secondary to acute cholecystitis  Present on Admission: . Sepsis ([place]) - .  -SIRS criteria met with  elevated white blood cell count,       Component Value Date/Time   WBC 16.6 (H) 08/07/2020 1607     tachycardia   ,   fever   RR >20 Today's Vitals   08/07/20 2017 08/07/20 2019 08/07/20 2023 08/07/20 2114  BP:   98/80 (!) 142/75  Pulse:   98 (!) 101  Resp:   19 (!) 27  Temp: 100.2 F (37.9 C)     TempSrc: Rectal     SpO2:   99% 97%  Weight:  62.1 kg    PainSc:       There is no height or weight on file to calculate BMI.  This patient meets SIRS Criteria and may be septic.      -Most likely source being:  intra-abdominal,   Patient meeting criteria for Severe sepsis with    evidence of end organ damage/organ dysfunction such as   acute metabolic encephalopathy   SBP<90 mmhg or MAP < 65 mmhg,       - Obtain serial lactic acid and procalcitonin level.  - Initiated IV antibiotics   - await results of blood and urine culture  - Rehydrate aggressively        30cc/kg fluid   9:55 PM  . Hyponatremia -in the setting of severe dehydration will rehydrate and follow obtain urine electrolytes    . Acute cholecystitis - admit for IV rehydration and pain control, bowel rest, and empiric antibiotics (zosyn)  Appreciate general surgery consult  plan for cholecystectomy prior to discharge If elevated LFTs in the a.m. may benefit from GI consult for ERCP/MRCP  dm 2-  - Order Sensitive  SSI     -  check TSH and HgA1C  - Hold by mouth medications    Other plan as per orders.  DVT prophylaxis:  SCD      Code Status:    Code Status: Not on file FULL CODE   as per family  I had personally discussed CODE STATUS with patient and family* I had spent *min discussing goals of care and CODE STATUS    Family Communication:   Family   at  Bedside  plan of care was discussed   with   Son  Disposition Plan:    To home once workup is complete and patient is stable   Following barriers for discharge:                                                      Pain controlled with PO medications  Afebrile, white count improving able to transition to PO antibiotics                             Will need to be able to tolerate PO                                                       Will need consultants to evaluate patient prior to discharge                      Would benefit from PT/OT eval prior to DC  Ordered                                      Diabetes care coordinator                                      Consults called:  General surgery is aware  Admission status:  ED Disposition    ED Disposition Condition [place]: [place] [[id number]]  Level of Care: Stepdown [14]  Admit to SDU based  on following criteria: Severe physiological/psychological symptoms:  Any diagnosis requiring assessment & intervention at least every 4 hours on an ongoing basis to obtain desired patient outcomes including stability and rehabilitation  May admit patient to [facility] or [facility] if equivalent level of care is available:: No  Covid Evaluation: Confirmed COVID Negative  Diagnosis: Sepsis [facility]) [[id number]]  Admitting Physician: Toy Baker [[id number]]  Attending Physician: Toy Baker [[id number]]  Estimated length of stay: past midnight tomorrow  Certification:: I certify this patient will need inpatient services for at least 2 midnights          inpatient     I Expect 2 midnight stay secondary to severity of patient's current illness need for inpatient interventions justified by the following:  hemodynamic instability despite optimal treatment (tachycardia  )   Severe lab/radiological/exam abnormalities including:    cholecystitis and extensive comorbidities including:    DM2    That are currently affecting medical management.   I expect  patient to be hospitalized for 2 midnights requiring inpatient medical care.  Patient is at high risk for adverse outcome (such as loss of life or disability) if not treated.  Indication for inpatient stay as follows:  Severe change from baseline regarding mental status   severe pain requiring acute inpatient management,  inability to maintain oral hydration    Need for operative/procedural  intervention    Need for IV antibiotics, IV fluids    Level of care       SDU tele indefinitely please discontinue once patient no longer qualifies COVID-19 Labs    Lab Results  Component Value Date   [place] NEGATIVE 08/07/2020     Precautions: admitted as Covid Negative    PPE: Used by the provider:   P100  eye Goggles,  Gloves       Tiffiny Worthy 08/08/2020, 12:07 AM    [facility]     after 2 AM please page  floor coverage PA If 7AM-7PM,  please contact the day team taking care of the patient using [email or web address]   Patient was evaluated in the context of the global COVID-19 pandemic, which necessitated consideration that the patient might be at risk for infection with the SARS-CoV-2 virus that causes COVID-19. Institutional protocols and algorithms that pertain to the evaluation of patients at risk for COVID-19 are in a state of rapid change based on information released by regulatory bodies including the CDC and federal and state organizations. These policies and algorithms were followed during the patient's care.

## 2020-08-07 NOTE — Sepsis Progress Note (Signed)
[facility] following this Code Sepsis. 

## 2020-08-07 NOTE — ED Provider Notes (Signed)
[facility]-EMERGENCY DEPT Provider Note   CSN: [id number] Arrival date & time: 08/07/20  1513     History Chief Complaint  Patient presents with  . Abdominal Pain    Bethany Diaz is a 59 y.o. female with past medical history of DM, hyperlipidemia, who presents today for evaluation of right-sided upper abdominal pain.  She was reportedly seen by her PCP and sent for outpatient ultrasound.  Ultrasound reportedly showed acute cholecystitis. Interactions with patient performed through professional [place] Creole video interpreter.  Patient reports that 3 days ago she started having right-sided upper abdominal pain.  She has had fevers and rigors.  She is not vaccinated against Covid and denies any known Covid contacts.  She reports on Monday she had one episode of vomiting however other than that no vomiting or diarrhea.  There was no blood in her vomit.  She denies any prior abdominal surgeries.  Her last p.o. intake was yesterday.  Her pain is made worse with moving and touch.  Made better with being still.    HPI     Past Medical History:  Diagnosis Date  . Diabetes mellitus without complication [facility])     Patient Active Problem List   Diagnosis Date Noted  . Acute calculous cholecystitis 08/07/2020  . Non-English speaking patient ([place] Creole preferred) 08/07/2020  . LFT elevation 08/07/2020  . Recommendation refused by patient 08/07/2020  . Sepsis (HCC) 08/07/2020  . Hyponatremia 08/07/2020  . Acute cholecystitis 08/07/2020  . Type 2 diabetes mellitus without complication, without long-term current use of insulin (HCC) 09/07/2018    Past Surgical History:  Procedure Laterality Date  . NO PAST SURGERIES       OB History   No obstetric history on file.     Family History  Problem Relation Age of Onset  . Diabetes Neg Hx     Social History   Tobacco Use  . Smoking status: Never Smoker  . Smokeless tobacco: Never Used  Vaping Use  .  Vaping Use: Never used  Substance Use Topics  . Alcohol use: Never  . Drug use: Never    Home Medications Prior to Admission medications   Medication Sig Start Date End Date Taking? Authorizing Provider  acetaminophen (TYLENOL) 500 MG tablet Take 1,000 mg by mouth every 6 (six) hours as needed for moderate pain.   Yes [provider]  metFORMIN (GLUCOPHAGE) 500 MG tablet Take 500 mg by mouth 2 (two) times daily with a meal.  10/10/19  Yes [provider]    Allergies    Other  Review of Systems   Review of Systems  Constitutional: Positive for chills, fatigue and fever.  HENT: Negative for congestion.   Respiratory: Negative for cough and shortness of breath.   Cardiovascular: Negative for chest pain.  Gastrointestinal: Positive for abdominal pain, nausea and vomiting. Negative for constipation and diarrhea.  Genitourinary: Negative for dysuria.  Musculoskeletal: Negative for back pain and neck pain.  Neurological: Negative for weakness and headaches.  All other systems reviewed and are negative.   Physical Exam Updated Vital Signs BP (!) 142/75   Pulse (!) 101   Temp 100.2 F (37.9 C) (Rectal)   Resp (!) 27   Wt 62.1 kg   SpO2 97%   Physical Exam Vitals and nursing note reviewed.  Constitutional:      General: She is not in acute distress.    Appearance: She is well-developed.  HENT:     Head: Normocephalic  and atraumatic.  Eyes:     Conjunctiva/sclera: Conjunctivae normal.  Cardiovascular:     Rate and Rhythm: Regular rhythm. Tachycardia present.     Heart sounds: Normal heart sounds. No murmur heard.   Pulmonary:     Effort: Pulmonary effort is normal. No respiratory distress.     Breath sounds: Normal breath sounds.  Abdominal:     General: Abdomen is flat. Bowel sounds are normal.     Palpations: Abdomen is soft.     Tenderness: There is abdominal tenderness in the right upper quadrant.     Hernia: No hernia is present.   Musculoskeletal:     Cervical back: Neck supple.  Skin:    General: Skin is warm and dry.  Neurological:     General: No focal deficit present.     Mental Status: She is alert.  Psychiatric:        Mood and Affect: Mood normal.        Behavior: Behavior normal.     ED Results / Procedures / Treatments   Labs (all labs ordered are listed, but only abnormal results are displayed) Labs Reviewed  COMPREHENSIVE METABOLIC PANEL - Abnormal; Notable for the following components:      Result Value   Sodium 132 (*)    Glucose, Bld 119 (*)    BUN 21 (*)    Total Protein 8.6 (*)    Total Bilirubin 2.8 (*)    All other components within normal limits  CBC - Abnormal; Notable for the following components:   WBC 16.6 (*)    All other components within normal limits  APTT - Abnormal; Notable for the following components:   aPTT 23 (*)    All other components within normal limits  CULTURE, BLOOD (ROUTINE X 2)  RESP PANEL BY RT-PCR (FLU A&B, COVID) ARPGX2  CULTURE, BLOOD (ROUTINE X 2)  URINE CULTURE  LIPASE, BLOOD  LACTIC ACID, PLASMA  PROTIME-INR  PROCALCITONIN  MAGNESIUM  CK  URINALYSIS, ROUTINE W REFLEX MICROSCOPIC  LACTIC ACID, PLASMA  HEMOGLOBIN A1C  CREATININE, SERUM  POTASSIUM  HEPATIC FUNCTION PANEL  LIPASE, BLOOD  CBC  SODIUM, URINE, RANDOM  CREATININE, URINE, RANDOM  OSMOLALITY, URINE  I-STAT BETA HCG BLOOD, ED (MC, WL, AP ONLY)  I-STAT BETA HCG BLOOD, ED (MC, [facility], AP ONLY)  CBG MONITORING, ED    EKG EKG Interpretation  Date/Time:  Wednesday August 07 2020 17:12:59 EST Ventricular Rate:  96 PR Interval:    QRS Duration: 76 QT Interval:  337 QTC Calculation: 426 R Axis:   55 Text Interpretation: Sinus rhythm Biatrial enlargement Confirmed by Geoffery Lyons ([id number]) on 08/07/2020 5:20:32 PM   Radiology DG Chest Port 1 View  Result Date: 08/07/2020 CLINICAL DATA:  Questionable sepsis, evaluate for abnormality. EXAM: PORTABLE CHEST 1 VIEW COMPARISON:   None. FINDINGS: Borderline enlargement the cardiac silhouette. Linear opacity at the lateral right lung base. Otherwise, no consolidation. No visible pleural effusions or pneumothorax. No acute osseous abnormality. IMPRESSION: Linear opacity at the lateral right lung base, favor atelectasis. Electronically Signed   By: Feliberto Harts MD   On: 08/07/2020 18:36   US Abdomen Limited RUQ (LIVER/GB)  Result Date: 08/07/2020 CLINICAL DATA:  Right upper quadrant abdominal pain. EXAM: ULTRASOUND ABDOMEN LIMITED RIGHT UPPER QUADRANT COMPARISON:  None. FINDINGS: Gallbladder: Gallbladder wall thickening. Positive Murphy sign per the technologist. Multiple gallstones, measuring up to 1.2 cm. There may be a small amount of pericholecystic fluid. Common bile duct: Diameter: 2.7 mm,  within normal limits. Liver: No focal lesion identified. Within normal limits in parenchymal echogenicity. Portal vein is patent on color Doppler imaging with normal direction of blood flow towards the liver. IMPRESSION: Findings concerning for acute cholecystitis, including gallbladder wall thickening, gallstones, and positive Murphy sign. These results will be called to the ordering clinician or representative by the Radiologist Assistant, and communication documented in the PACS or [employer]. Electronically Signed   By: Feliberto Harts MD   On: 08/07/2020 15:39    Procedures .Critical Care Performed by: Cristina Gong, PA-C Authorized by: Cristina Gong, PA-C   Critical care provider statement:    Critical care time (minutes):  45   Critical care was necessary to treat or prevent imminent or life-threatening deterioration of the following conditions:  Sepsis   Critical care was time spent personally by me on the following activities:  Discussions with consultants, evaluation of patient's response to treatment, examination of patient, ordering and performing treatments and interventions, ordering and review of  laboratory studies, ordering and review of radiographic studies, pulse oximetry, re-evaluation of patient's condition, obtaining history from patient or surrogate and review of old charts   (including critical care time)  Medications Ordered in ED Medications  lactated ringers infusion ( Intravenous New Bag/Given 08/07/20 1830)  ondansetron (ZOFRAN) injection 4 mg (0 mg Intravenous Hold 08/07/20 1752)  fentaNYL (SUBLIMAZE) injection 50 mcg (has no administration in time range)  acetaminophen (TYLENOL) tablet 650 mg (650 mg Oral Given 08/07/20 1613)  lactated ringers bolus 500 mL (0 mLs Intravenous Stopped 08/07/20 1830)  piperacillin-tazobactam (ZOSYN) IVPB 3.375 g ( Intravenous Stopped 08/07/20 1750)  ondansetron (ZOFRAN) injection 4 mg (4 mg Intravenous Given 08/07/20 1735)  lactated ringers bolus 1,000 mL (1,000 mLs Intravenous New Bag/Given 08/07/20 2023)  sodium chloride 0.9 % bolus 500 mL (500 mLs Intravenous New Bag/Given 08/07/20 2144)    ED Course  I have reviewed the triage vital signs and the nursing notes.  Pertinent labs & imaging results that were available during my care of the patient were reviewed by me and considered in my medical decision making (see chart for details).  Clinical Course as of Aug 07 2210  Wed Aug 07, 2020  8144 I spoke with Dr. Michaell Cowing who will see patient.    [EH]  2015 I spoke with Dr. Michaell Cowing, he discussed interpreter with patient surgery however patient is refusing.  He states that if patient will consent to being admitted they will follow along and reevaluate her in the morning.  I went to reevaluate patient, her blood pressure has dropped to 80 systolic and she appears to be having rigors.  1 L fluid bolus ordered and will obtain rectal temp.  Son is on his way here.     [EH]  2037 Patient's son arrived, I spoke with patient and her son.  We discussed the possible complications of refusing surgery and patient is willing to consent to surgery, and her son  wants her to have surgery.  Will admit to Medicine.     [EH]    Clinical Course User Index [EH] Cristina Gong, PA-C   MDM Rules/Calculators/A&P                         Patient is a 59 year old woman who presents today for evaluation after having right upper quadrant abdominal pain with 3 days.  Ultrasound obtained prior to arrival shows concern for cholecystitis.  On arrival here  she is febrile and tachycardic.  Code sepsis is called, she is started on Zosyn.  IV fluids are ordered.  Labs obtained and reviewed, CBC shows cytosis with a white count of 16.Marland Kitchen.  CMP shows total bili is elevated at 8.  Procalcitonin, magnesium lipase and CK are all normal..   Covid test is negative.  Patient requires admission.  I spoke with Hospitalist Dr. Adela Glimpseoutova who will see patient.  I spoke with Dr. Michaell CowingGross of surgery who saw the patient.  Patient had refused surgery.   I spoke with patient and her son who is present at bedside.   Note: Portions of this report may have been transcribed using voice recognition software. Every effort was made to ensure accuracy; however, inadvertent computerized transcription errors may be present  Final Clinical Impression(s) / ED Diagnoses Final diagnoses:  Cholecystitis  Sepsis, due to unspecified organism, unspecified whether acute organ dysfunction present [facility](HCC)    Rx / DC Orders ED Discharge Orders    None       Norman ClayHammond, Lenola Lockner W, PA-C 08/07/20 2211    Geoffery Lyonselo, Douglas, MD 08/07/20 2223

## 2020-08-07 NOTE — ED Notes (Signed)
Elizabeth, PA at bedside.  

## 2020-08-08 ENCOUNTER — Inpatient Hospital Stay (HOSPITAL_COMMUNITY): Payer: Self-pay | Admitting: Anesthesiology

## 2020-08-08 ENCOUNTER — Encounter (HOSPITAL_COMMUNITY): Payer: Self-pay | Admitting: Internal Medicine

## 2020-08-08 ENCOUNTER — Encounter (HOSPITAL_COMMUNITY)
Admission: EM | Disposition: A | Discharge status: Home / Self Care | Payer: Self-pay | Source: Home / Self Care | Attending: Internal Medicine

## 2020-08-08 HISTORY — PX: CHOLECYSTECTOMY: SHX55

## 2020-08-08 LAB — URINALYSIS, ROUTINE W REFLEX MICROSCOPIC
Bacteria, UA: NONE SEEN
Bilirubin Urine: NEGATIVE
Glucose, UA: NEGATIVE mg/dL
Ketones, ur: 20 mg/dL — AB
Nitrite: NEGATIVE
Protein, ur: NEGATIVE mg/dL
Specific Gravity, Urine: 1.011 (ref 1.005–1.030)
pH: 6 (ref 5.0–8.0)

## 2020-08-08 LAB — PHOSPHORUS: Phosphorus: 1.7 mg/dL — ABNORMAL LOW (ref 2.5–4.6)

## 2020-08-08 LAB — GLUCOSE, CAPILLARY
Glucose-Capillary: 100 mg/dL — ABNORMAL HIGH (ref 70–99)
Glucose-Capillary: 122 mg/dL — ABNORMAL HIGH (ref 70–99)
Glucose-Capillary: 133 mg/dL — ABNORMAL HIGH (ref 70–99)
Glucose-Capillary: 157 mg/dL — ABNORMAL HIGH (ref 70–99)
Glucose-Capillary: 173 mg/dL — ABNORMAL HIGH (ref 70–99)
Glucose-Capillary: 76 mg/dL (ref 70–99)
Glucose-Capillary: 87 mg/dL (ref 70–99)

## 2020-08-08 LAB — BLOOD CULTURE ID PANEL (REFLEXED) - BCID2

## 2020-08-08 LAB — COMPREHENSIVE METABOLIC PANEL
ALT: 31 U/L (ref 0–44)
AST: 31 U/L (ref 15–41)
Albumin: 3.2 g/dL — ABNORMAL LOW (ref 3.5–5.0)
Alkaline Phosphatase: 99 U/L (ref 38–126)
Anion gap: 10 (ref 5–15)
BUN: 15 mg/dL (ref 6–20)
CO2: 22 mmol/L (ref 22–32)
Calcium: 8.2 mg/dL — ABNORMAL LOW (ref 8.9–10.3)
Chloride: 103 mmol/L (ref 98–111)
Creatinine, Ser: 0.82 mg/dL (ref 0.44–1.00)
GFR, Estimated: >60 mL/min (ref >60)
Glucose, Bld: 125 mg/dL — ABNORMAL HIGH (ref 70–99)
Potassium: 2.7 mmol/L — CL (ref 3.5–5.1)
Sodium: 135 mmol/L (ref 135–145)
Total Bilirubin: 2.8 mg/dL — ABNORMAL HIGH (ref 0.3–1.2)
Total Protein: 6.9 g/dL (ref 6.5–8.1)

## 2020-08-08 LAB — CBC WITH DIFFERENTIAL/PLATELET
Abs Immature Granulocytes: 0.21 10*3/uL — ABNORMAL HIGH (ref 0.00–0.07)
Basophils Absolute: 0 10*3/uL (ref 0.0–0.1)
Basophils Relative: 0 %
Eosinophils Absolute: 0 10*3/uL (ref 0.0–0.5)
Eosinophils Relative: 0 %
HCT: 34.3 % — ABNORMAL LOW (ref 36.0–46.0)
Hemoglobin: 11.6 g/dL — ABNORMAL LOW (ref 12.0–15.0)
Immature Granulocytes: 2 %
Lymphocytes Relative: 8 %
Lymphs Abs: 1 10*3/uL (ref 0.7–4.0)
MCH: 30.8 pg (ref 26.0–34.0)
MCHC: 33.8 g/dL (ref 30.0–36.0)
MCV: 91 fL (ref 80.0–100.0)
Monocytes Absolute: 1.1 10*3/uL — ABNORMAL HIGH (ref 0.1–1.0)
Monocytes Relative: 9 %
Neutro Abs: 10.7 10*3/uL — ABNORMAL HIGH (ref 1.7–7.7)
Neutrophils Relative %: 81 %
Platelets: 129 10*3/uL — ABNORMAL LOW (ref 150–400)
RBC: 3.77 MIL/uL — ABNORMAL LOW (ref 3.87–5.11)
RDW: 12.8 % (ref 11.5–15.5)
WBC: 13 10*3/uL — ABNORMAL HIGH (ref 4.0–10.5)
nRBC: 0 % (ref 0.0–0.2)

## 2020-08-08 LAB — MRSA PCR SCREENING: MRSA by PCR: NEGATIVE

## 2020-08-08 LAB — LACTIC ACID, PLASMA: Lactic Acid, Venous: 1.2 mmol/L (ref 0.5–1.9)

## 2020-08-08 LAB — SODIUM, URINE, RANDOM: Sodium, Ur: 95 mmol/L

## 2020-08-08 LAB — HEMOGLOBIN A1C
Hgb A1c MFr Bld: 5.7 % — ABNORMAL HIGH (ref 4.8–5.6)
Mean Plasma Glucose: 116.89 mg/dL

## 2020-08-08 LAB — HIV ANTIBODY (ROUTINE TESTING W REFLEX): HIV Screen 4th Generation wRfx: NONREACTIVE

## 2020-08-08 LAB — OSMOLALITY, URINE: Osmolality, Ur: 446 mOsm/kg (ref 300–900)

## 2020-08-08 LAB — TSH: TSH: 0.564 u[IU]/mL (ref 0.350–4.500)

## 2020-08-08 LAB — CREATININE, URINE, RANDOM: Creatinine, Urine: 64.13 mg/dL

## 2020-08-08 LAB — MAGNESIUM: Magnesium: 1.8 mg/dL (ref 1.7–2.4)

## 2020-08-08 LAB — LIPASE, BLOOD: Lipase: 24 U/L (ref 11–51)

## 2020-08-08 SURGERY — LAPAROSCOPIC CHOLECYSTECTOMY WITH INTRAOPERATIVE CHOLANGIOGRAM PEDIATRIC
Anesthesia: General | Site: Abdomen

## 2020-08-08 MED ORDER — ACETAMINOPHEN 500 MG PO TABS: 1000.0000 mg | ORAL_TABLET | Freq: Four times a day (QID) | ORAL | 0 refills | Status: AC | PRN

## 2020-08-08 MED ORDER — RINGERS IRRIGATION IR SOLN
Status: DC | PRN
  Administered 2020-08-08: 1000 mL

## 2020-08-08 MED ORDER — KETOROLAC TROMETHAMINE 15 MG/ML IJ SOLN
15.0000 mg | Freq: Three times a day (TID) | INTRAMUSCULAR | Status: DC
  Administered 2020-08-08 – 2020-08-10 (×5): 15 mg via INTRAVENOUS
  Filled 2020-08-08 (×5): qty 1

## 2020-08-08 MED ORDER — PHENYLEPHRINE 40 MCG/ML (10ML) SYRINGE FOR IV PUSH (FOR BLOOD PRESSURE SUPPORT)
PREFILLED_SYRINGE | INTRAVENOUS | Status: DC | PRN
  Administered 2020-08-08: 80 ug via INTRAVENOUS

## 2020-08-08 MED ORDER — IBUPROFEN 800 MG PO TABS: 800.0000 mg | ORAL_TABLET | Freq: Three times a day (TID) | ORAL | 0 refills | Status: AC

## 2020-08-08 MED ORDER — LACTATED RINGERS IV SOLN: INTRAVENOUS | Status: DC

## 2020-08-08 MED ORDER — PROPOFOL 10 MG/ML IV BOLUS
INTRAVENOUS | Status: DC | PRN
  Administered 2020-08-08: 100 mg via INTRAVENOUS

## 2020-08-08 MED ORDER — BUPIVACAINE-EPINEPHRINE 0.5% -1:200000 IJ SOLN
INTRAMUSCULAR | Status: AC
  Filled 2020-08-08: qty 1

## 2020-08-08 MED ORDER — ACETAMINOPHEN 325 MG PO TABS
650.0000 mg | ORAL_TABLET | Freq: Four times a day (QID) | ORAL | Status: DC | PRN
  Administered 2020-08-08: 650 mg via ORAL

## 2020-08-08 MED ORDER — FENTANYL CITRATE (PF) 250 MCG/5ML IJ SOLN
INTRAMUSCULAR | Status: AC
  Filled 2020-08-08: qty 5

## 2020-08-08 MED ORDER — ONDANSETRON HCL 4 MG/2ML IJ SOLN
INTRAMUSCULAR | Status: AC
  Filled 2020-08-08: qty 2

## 2020-08-08 MED ORDER — HYDROMORPHONE HCL 1 MG/ML IJ SOLN: 0.5000 mg | INTRAMUSCULAR | Status: DC | PRN

## 2020-08-08 MED ORDER — POTASSIUM CHLORIDE 10 MEQ/100ML IV SOLN: 10.0000 meq | INTRAVENOUS | Status: AC

## 2020-08-08 MED ORDER — PIPERACILLIN-TAZOBACTAM 3.375 G IVPB
3.3750 g | Freq: Three times a day (TID) | INTRAVENOUS | Status: DC
  Administered 2020-08-08 – 2020-08-09 (×3): 3.375 g via INTRAVENOUS
  Filled 2020-08-08 (×3): qty 50

## 2020-08-08 MED ORDER — MIDAZOLAM HCL 2 MG/2ML IJ SOLN
INTRAMUSCULAR | Status: AC
  Filled 2020-08-08: qty 2

## 2020-08-08 MED ORDER — OXYCODONE HCL 5 MG PO TABS: 10.0000 mg | ORAL_TABLET | ORAL | Status: DC | PRN

## 2020-08-08 MED ORDER — ACETAMINOPHEN 650 MG RE SUPP: 650.0000 mg | Freq: Four times a day (QID) | RECTAL | Status: DC | PRN

## 2020-08-08 MED ORDER — ACETAMINOPHEN 325 MG PO TABS
650.0000 mg | ORAL_TABLET | Freq: Four times a day (QID) | ORAL | Status: DC
  Administered 2020-08-08 – 2020-08-10 (×8): 650 mg via ORAL
  Filled 2020-08-08 (×8): qty 2

## 2020-08-08 MED ORDER — CHLORHEXIDINE GLUCONATE CLOTH 2 % EX PADS
6.0000 | MEDICATED_PAD | Freq: Every day | CUTANEOUS | Status: DC
  Administered 2020-08-08 – 2020-08-10 (×3): 6 via TOPICAL

## 2020-08-08 MED ORDER — SUGAMMADEX SODIUM 200 MG/2ML IV SOLN
INTRAVENOUS | Status: DC | PRN
  Administered 2020-08-08: 130 mg via INTRAVENOUS

## 2020-08-08 MED ORDER — SODIUM CHLORIDE 0.9 % IV SOLN
75.0000 mL/h | INTRAVENOUS | Status: DC
  Administered 2020-08-08: 75 mL/h via INTRAVENOUS

## 2020-08-08 MED ORDER — CELECOXIB 200 MG PO CAPS
200.0000 mg | ORAL_CAPSULE | ORAL | Status: AC
  Administered 2020-08-08: 200 mg via ORAL
  Filled 2020-08-08: qty 1

## 2020-08-08 MED ORDER — GABAPENTIN 300 MG PO CAPS
300.0000 mg | ORAL_CAPSULE | Freq: Three times a day (TID) | ORAL | Status: DC
  Administered 2020-08-08 – 2020-08-10 (×6): 300 mg via ORAL
  Filled 2020-08-08 (×6): qty 1

## 2020-08-08 MED ORDER — OXYCODONE HCL 5 MG PO TABS
5.0000 mg | ORAL_TABLET | ORAL | Status: DC | PRN
  Administered 2020-08-09: 5 mg via ORAL
  Filled 2020-08-08: qty 1

## 2020-08-08 MED ORDER — SODIUM CHLORIDE 0.9 % IR SOLN
Status: DC | PRN
  Administered 2020-08-08: 1000 mL

## 2020-08-08 MED ORDER — ROCURONIUM BROMIDE 10 MG/ML (PF) SYRINGE
PREFILLED_SYRINGE | INTRAVENOUS | Status: AC
  Filled 2020-08-08: qty 10

## 2020-08-08 MED ORDER — HYDROCODONE-ACETAMINOPHEN 5-325 MG PO TABS
1.0000 | ORAL_TABLET | ORAL | 0 refills | Status: AC | PRN
Start: 2020-08-08 — End: 2021-08-08

## 2020-08-08 MED ORDER — PROPOFOL 10 MG/ML IV BOLUS
INTRAVENOUS | Status: AC
  Filled 2020-08-08: qty 20

## 2020-08-08 MED ORDER — POTASSIUM CHLORIDE 10 MEQ/100ML IV SOLN
10.0000 meq | INTRAVENOUS | Status: AC
  Administered 2020-08-08 (×4): 10 meq via INTRAVENOUS
  Filled 2020-08-08 (×4): qty 100

## 2020-08-08 MED ORDER — FENTANYL CITRATE (PF) 100 MCG/2ML IJ SOLN
INTRAMUSCULAR | Status: DC | PRN
  Administered 2020-08-08 (×3): 50 ug via INTRAVENOUS
  Administered 2020-08-08: 100 ug via INTRAVENOUS

## 2020-08-08 MED ORDER — DEXAMETHASONE SODIUM PHOSPHATE 10 MG/ML IJ SOLN
INTRAMUSCULAR | Status: DC | PRN
  Administered 2020-08-08: 10 mg via INTRAVENOUS

## 2020-08-08 MED ORDER — ACETAMINOPHEN 500 MG PO TABS
ORAL_TABLET | ORAL | Status: AC
  Administered 2020-08-08: 1000 mg via ORAL
  Filled 2020-08-08: qty 2

## 2020-08-08 MED ORDER — ROCURONIUM BROMIDE 10 MG/ML (PF) SYRINGE
PREFILLED_SYRINGE | INTRAVENOUS | Status: DC | PRN
  Administered 2020-08-08: 40 mg via INTRAVENOUS

## 2020-08-08 MED ORDER — LIDOCAINE 2% (20 MG/ML) 5 ML SYRINGE
INTRAMUSCULAR | Status: DC | PRN
  Administered 2020-08-08: 40 mg via INTRAVENOUS

## 2020-08-08 MED ORDER — DEXAMETHASONE SODIUM PHOSPHATE 10 MG/ML IJ SOLN
INTRAMUSCULAR | Status: AC
  Filled 2020-08-08: qty 1

## 2020-08-08 MED ORDER — OXYCODONE HCL 5 MG PO TABS: 5.0000 mg | ORAL_TABLET | ORAL | Status: DC | PRN

## 2020-08-08 MED ORDER — MIDAZOLAM HCL 5 MG/5ML IJ SOLN
INTRAMUSCULAR | Status: DC | PRN
  Administered 2020-08-08: 2 mg via INTRAVENOUS

## 2020-08-08 MED ORDER — SODIUM CHLORIDE 0.9 % IV BOLUS
500.0000 mL | Freq: Once | INTRAVENOUS | Status: AC
  Administered 2020-08-08: 500 mL via INTRAVENOUS

## 2020-08-08 MED ORDER — ONDANSETRON HCL 4 MG/2ML IJ SOLN
INTRAMUSCULAR | Status: DC | PRN
  Administered 2020-08-08: 4 mg via INTRAVENOUS

## 2020-08-08 MED ORDER — BUPIVACAINE-EPINEPHRINE 0.5% -1:200000 IJ SOLN
INTRAMUSCULAR | Status: DC | PRN
  Administered 2020-08-08: 30 mL

## 2020-08-08 SURGICAL SUPPLY — 37 items
APPLIER CLIP ROT 10 11.4 M/L (STAPLE) ×2
CABLE HIGH FREQUENCY MONO STRZ (ELECTRODE) ×2 IMPLANT
CHLORAPREP W/TINT 26 (MISCELLANEOUS) ×2 IMPLANT
CLIP APPLIE ROT 10 11.4 M/L (STAPLE) ×1 IMPLANT
COVER MAYO STAND STRL (DRAPES) IMPLANT
COVER SURGICAL LIGHT HANDLE (MISCELLANEOUS) ×2 IMPLANT
COVER WAND RF STERILE (DRAPES) IMPLANT
DECANTER SPIKE VIAL GLASS SM (MISCELLANEOUS) ×2 IMPLANT
DERMABOND ADVANCED (GAUZE/BANDAGES/DRESSINGS) ×1
DERMABOND ADVANCED .7 DNX12 (GAUZE/BANDAGES/DRESSINGS) ×1 IMPLANT
DRAPE C-ARM 42X120 X-RAY (DRAPES) IMPLANT
ELECT REM PT RETURN 15FT ADLT (MISCELLANEOUS) ×2 IMPLANT
GLOVE BIO SURGEON STRL SZ7.5 (GLOVE) ×2 IMPLANT
GLOVE BIOGEL PI IND STRL 8 (GLOVE) ×1 IMPLANT
GLOVE BIOGEL PI INDICATOR 8 (GLOVE) ×1
GOWN STRL REUS W/TWL LRG LVL3 (GOWN DISPOSABLE) ×2 IMPLANT
GOWN STRL REUS W/TWL XL LVL3 (GOWN DISPOSABLE) ×4 IMPLANT
GRASPER SUT TROCAR 14GX15 (MISCELLANEOUS) ×2 IMPLANT
HEMOSTAT SNOW SURGICEL 2X4 (HEMOSTASIS) IMPLANT
IV CATH 14GX2 1/4 (CATHETERS) ×2 IMPLANT
KIT BASIN OR (CUSTOM PROCEDURE TRAY) ×2 IMPLANT
KIT TURNOVER KIT A (KITS) IMPLANT
NEEDLE INSUFFLATION 14GA 120MM (NEEDLE) ×2 IMPLANT
PENCIL SMOKE EVACUATOR (MISCELLANEOUS) IMPLANT
POUCH RETRIEVAL ECOSAC 10 (ENDOMECHANICALS) ×1 IMPLANT
POUCH RETRIEVAL ECOSAC 10MM (ENDOMECHANICALS) ×1
SCISSORS LAP 5X35 DISP (ENDOMECHANICALS) ×2 IMPLANT
SET CHOLANGIOGRAPH MIX (MISCELLANEOUS) IMPLANT
SET IRRIG TUBING LAPAROSCOPIC (IRRIGATION / IRRIGATOR) ×2 IMPLANT
SET TUBE SMOKE EVAC HIGH FLOW (TUBING) ×2 IMPLANT
SLEEVE XCEL OPT CAN 5 100 (ENDOMECHANICALS) ×4 IMPLANT
SUT MNCRL AB 4-0 PS2 18 (SUTURE) ×2 IMPLANT
TOWEL OR 17X26 10 PK STRL BLUE (TOWEL DISPOSABLE) ×2 IMPLANT
TOWEL OR NON WOVEN STRL DISP B (DISPOSABLE) IMPLANT
TRAY LAPAROSCOPIC (CUSTOM PROCEDURE TRAY) ×2 IMPLANT
TROCAR BLADELESS OPT 5 100 (ENDOMECHANICALS) ×2 IMPLANT
TROCAR XCEL 12X100 BLDLESS (ENDOMECHANICALS) ×2 IMPLANT

## 2020-08-08 NOTE — Plan of Care (Signed)
  Problem: Education: Goal: Knowledge of General Education information will improve Description: Including pain rating scale, medication(s)/side effects and non-pharmacologic comfort measures Outcome: Progressing   Problem: Coping: Goal: Level of anxiety will decrease Outcome: Progressing   Problem: Pain Managment: Goal: General experience of comfort will improve Outcome: Progressing   

## 2020-08-08 NOTE — Anesthesia Procedure Notes (Signed)
Procedure Name: Intubation Date/Time: 08/08/2020 12:04 PM Performed by: Gerald Leitz, CRNA Pre-anesthesia Checklist: Patient identified, Patient being monitored, Timeout performed, Emergency Drugs available and Suction available Patient Re-evaluated:Patient Re-evaluated prior to induction Oxygen Delivery Method: Circle system utilized Preoxygenation: Pre-oxygenation with 100% oxygen Induction Type: IV induction Ventilation: Mask ventilation without difficulty Laryngoscope Size: Mac and 3 Grade View: Grade I Tube type: Oral Tube size: 7.0 mm Number of attempts: 1 Placement Confirmation: ETT inserted through vocal cords under direct vision,  positive ETCO2 and breath sounds checked- equal and bilateral Secured at: 22 cm Tube secured with: Tape Dental Injury: Teeth and Oropharynx as per pre-operative assessment

## 2020-08-08 NOTE — Progress Notes (Signed)
PROGRESS NOTE  Bethany Diaz SHF:[id number] DOB: November 04, 1960 DOA: 08/07/2020 PCP: Patient, No Pcp Per   LOS: 1 day   Brief Narrative / Interim history: 59 year old female with history of DM2, HLD, came to the hospital with complaints of abdominal pain, mainly in the right upper quadrant associated with nausea, vomiting and fever.  This has been going on for the past couple of days.  In the ED she was febrile, septic appearing and ultrasound showed acute cholecystitis.  General surgery was consulted.  Initially patient refused admission/surgery and wanted to be discharged home however later changed her mind and she was admitted to the hospital and general surgery was consulted.  Subjective / 24h Interval events: She is doing a little bit better this morning, still with nausea and right upper quadrant abdominal pain.  Interview via [place] audio interpreter  Assessment & Plan: Principal Problem Sepsis due to acute cholangitis -patient met sepsis criteria on admission with fever, tachycardia, tachypnea as well as elevated WBC.  Blood pressure was also soft.  She was placed on intravenous antibiotics with Zosyn as well as IV fluids and general surgery was consulted.  Initially she refused surgery however this morning she is agreeable.  Looks like she is scheduled this afternoon.  Continue antibiotics and supportive treatment.  Active Problems Diabetes mellitus -It appears that she is on Metformin at home.  Hold that and provide sliding scale while hospitalized.  Scheduled Meds: . acetaminophen  1,000 mg Oral On Call to OR  . bupivacaine liposome  20 mL Infiltration Once  . celecoxib  200 mg Oral On Call to OR  . Chlorhexidine Gluconate Cloth  6 each Topical Daily  . gabapentin  300 mg Oral On Call to OR  . insulin aspart  0-9 Units Subcutaneous Q4H  . ondansetron (ZOFRAN) IV  4 mg Intravenous Once   Continuous Infusions: . lactated ringers Stopped (08/07/20 2357)  .  piperacillin-tazobactam (ZOSYN)  IV 3.375 g (08/08/20 0917)   PRN Meds:.acetaminophen **OR** acetaminophen, fentaNYL (SUBLIMAZE) injection  Diet Orders (From admission, onward)    Start     Ordered   08/08/20 0615  Diet NPO time specified Except for: Ice Chips, Sips with Meds  Diet effective now       Question Answer Comment  Except for Ice Chips   Except for Sips with Meds      08/08/20 0614          DVT prophylaxis: SCDs Start: 08/08/20 0006 SCD's Start: 08/07/20 2204     Code Status: Full Code  Family Communication: No family at bedside  Status is: Inpatient  Remains inpatient appropriate because:Inpatient level of care appropriate due to severity of illness   Dispo: The patient is from: Home              Anticipated d/c is to: Home              Anticipated d/c date is: 2 days              Patient currently is not medically stable to d/c.  Consultants:  General surgery   Procedures:  None   Microbiology  None   Antimicrobials: Zosyn 12/8 >>     Objective: Vitals:   08/08/20 0645 08/08/20 0700 08/08/20 0800 08/08/20 0900  BP: 100/62 (!) 108/56 111/66 95/62  Pulse: 79 81 78 81  Resp: 19 13 (!) 21 20  Temp:   98.3 F (36.8 C)   TempSrc:   Oral  SpO2: 97% 96% 98% 99%  Weight:      Height:        Intake/Output Summary (Last 24 hours) at 08/08/2020 1021 Last data filed at 08/08/2020 0900 Gross per 24 hour  Intake 2689.43 ml  Output 1400 ml  Net 1289.43 ml   Filed Weights   08/07/20 2019 08/08/20 0049  Weight: 62.1 kg 62.5 kg    Examination:  Constitutional: NAD Eyes: no scleral icterus ENMT: Mucous membranes are moist.  Neck: normal, supple Respiratory: clear to auscultation bilaterally, no wheezing, no crackles.  Cardiovascular: Regular rate and rhythm, no murmurs / rubs / gallops. No LE edema. Good peripheral pulses Abdomen: Mild tenderness in the right side, no guarding or rebound, bowel sounds positive.  Musculoskeletal: no clubbing /  cyanosis.  Skin: no rashes Neurologic: CN 2-12 grossly intact. Strength 5/5 in all 4.   Data Reviewed: I have independently reviewed following labs and imaging studies   CBC: Recent Labs  Lab 08/07/20 1607 08/08/20 0102  WBC 16.6* 13.0*  NEUTROABS  --  10.7*  HGB 13.6 11.6*  HCT 39.9 34.3*  MCV 89.1 91.0  PLT 160 342*   Basic Metabolic Panel: Recent Labs  Lab 08/07/20 1607 08/08/20 0102  NA 132* 135  K 3.5 2.7*  CL 98 103  CO2 22 22  GLUCOSE 119* 125*  BUN 21* 15  CREATININE 0.86 0.82  CALCIUM 9.2 8.2*  MG 2.3 1.8  PHOS  --  1.7*   Liver Function Tests: Recent Labs  Lab 08/07/20 1607 08/08/20 0102  AST 36 31  ALT 37 31  ALKPHOS 107 99  BILITOT 2.8* 2.8*  PROT 8.6* 6.9  ALBUMIN 4.0 3.2*   Coagulation Profile: Recent Labs  Lab 08/07/20 1645  INR 1.2   HbA1C: Recent Labs    08/08/20 0102  HGBA1C 5.7*   CBG: Recent Labs  Lab 08/07/20 2028 08/07/20 2353 08/08/20 0420 08/08/20 0740  GLUCAP 94 145* 122* 87    Recent Results (from the past 240 hour(s))  Culture, blood (routine x 2)     Status: None (Preliminary result)   Collection Time: 08/07/20  4:34 PM   Specimen: BLOOD LEFT FOREARM  Result Value Ref Range Status   Specimen Description   Final    BLOOD LEFT FOREARM Performed at [facility] [address] [place], [place] [address]    Special Requests   Final    BOTTLES DRAWN AEROBIC AND ANAEROBIC Blood Culture adequate volume   Culture   Final    NO GROWTH < 24 HOURS Performed at [facility], [place] [address] [place], [place] [address]    Report Status PENDING  Incomplete  Culture, blood (routine x 2)     Status: None (Preliminary result)   Collection Time: 08/07/20  4:36 PM   Specimen: BLOOD  Result Value Ref Range Status   Specimen Description   Final    BLOOD LEFT HAND Performed at [facility] [address], [place], [place] [address]    Special Requests   Final    BOTTLES  DRAWN AEROBIC AND ANAEROBIC Blood Culture adequate volume Performed at [facility] [address], [place], [place] [address]    Culture   Final    NO GROWTH < 12 HOURS Performed at [facility] [address], [place],  [address]    Report Status PENDING  Incomplete  Resp Panel by RT-PCR (Flu A&B, Covid) Nasopharyngeal Swab  Status: None   Collection Time: 08/07/20  4:47 PM   Specimen: Nasopharyngeal Swab; Nasopharyngeal(NP) swabs in vial transport medium  Result Value Ref Range Status   SARS Coronavirus 2 by RT PCR NEGATIVE NEGATIVE Final    Comment: (NOTE) SARS-CoV-2 target nucleic acids are NOT DETECTED.  The SARS-CoV-2 RNA is generally detectable in upper respiratory specimens during the acute phase of infection. The lowest concentration of SARS-CoV-2 viral copies this assay can detect is 138 copies/mL. A negative result does not preclude SARS-Cov-2 infection and should not be used as the sole basis for treatment or other patient management decisions. A negative result may occur with  improper specimen collection/handling, submission of specimen other than nasopharyngeal swab, presence of viral mutation(s) within the areas targeted by this assay, and inadequate number of viral copies(<138 copies/mL). A negative result must be combined with clinical observations, patient history, and epidemiological information. The expected result is Negative.  Fact Sheet for Patients:  EntrepreneurPulse.com.au  Fact Sheet for Healthcare Providers:  IncredibleEmployment.be  This test is no t yet approved or cleared by the [place] FDA and  has been authorized for detection and/or diagnosis of SARS-CoV-2 by FDA under an Emergency Use Authorization (EUA). This EUA will remain  in effect (meaning this test can be used) for the duration of the COVID-19 declaration under Section 564(b)(1) of the Act, 21 U.S.C.section  360bbb-3(b)(1), unless the authorization is terminated  or revoked sooner.       Influenza A by PCR NEGATIVE NEGATIVE Final   Influenza B by PCR NEGATIVE NEGATIVE Final    Comment: (NOTE) The Xpert Xpress SARS-CoV-2/FLU/RSV plus assay is intended as an aid in the diagnosis of influenza from Nasopharyngeal swab specimens and should not be used as a sole basis for treatment. Nasal washings and aspirates are unacceptable for Xpert Xpress SARS-CoV-2/FLU/RSV testing.  Fact Sheet for Patients: EntrepreneurPulse.com.au  Fact Sheet for Healthcare Providers: IncredibleEmployment.be  This test is not yet approved or cleared by the [place] FDA and has been authorized for detection and/or diagnosis of SARS-CoV-2 by FDA under an Emergency Use Authorization (EUA). This EUA will remain in effect (meaning this test can be used) for the duration of the COVID-19 declaration under Section 564(b)(1) of the Act, 21 U.S.C. section 360bbb-3(b)(1), unless the authorization is terminated or revoked.  Performed at [facility], [place] [address], [place], [place] [address]   MRSA PCR Screening     Status: None   Collection Time: 08/08/20 12:54 AM   Specimen: Nasopharyngeal  Result Value Ref Range Status   MRSA by PCR NEGATIVE NEGATIVE Final    Comment:        The GeneXpert MRSA Assay (FDA approved for NASAL specimens only), is one component of a comprehensive MRSA colonization surveillance program. It is not intended to diagnose MRSA infection nor to guide or monitor treatment for MRSA infections. Performed at [facility], [place] [address] [place], [place] [address]      Radiology Studies: [facility] Chest Port 1 View  Result Date: 08/07/2020 CLINICAL DATA:  Questionable sepsis, evaluate for abnormality. EXAM: PORTABLE CHEST 1 VIEW COMPARISON:  None. FINDINGS: Borderline enlargement the cardiac silhouette. Linear opacity  at the lateral right lung base. Otherwise, no consolidation. No visible pleural effusions or pneumothorax. No acute osseous abnormality. IMPRESSION: Linear opacity at the lateral right lung base, favor atelectasis. Electronically Signed   By: Margaretha Sheffield MD   On: 08/07/2020 18:36   US Abdomen Limited RUQ (LIVER/GB)  Result Date: 08/07/2020 CLINICAL DATA:  Right upper quadrant abdominal pain. EXAM: ULTRASOUND ABDOMEN LIMITED RIGHT UPPER QUADRANT COMPARISON:  None. FINDINGS: Gallbladder: Gallbladder wall thickening. Positive Murphy sign per the technologist. Multiple gallstones, measuring up to 1.2 cm. There may be a small amount of pericholecystic fluid. Common bile duct: Diameter: 2.7 mm, within normal limits. Liver: No focal lesion identified. Within normal limits in parenchymal echogenicity. Portal vein is patent on color Doppler imaging with normal direction of blood flow towards the liver. IMPRESSION: Findings concerning for acute cholecystitis, including gallbladder wall thickening, gallstones, and positive Murphy sign. These results will be called to the ordering clinician or representative by the Radiologist Assistant, and communication documented in the PACS or [employer]. Electronically Signed   By: Margaretha Sheffield MD   On: 08/07/2020 15:39   Marzetta Board, MD, PhD [facility]  Between 7 am - 7 pm I am available, please contact me via Amion or Securechat  Between 7 pm - 7 am I am not available, please contact night coverage MD/APP via Amion

## 2020-08-08 NOTE — Op Note (Addendum)
Patient: Bethany Diaz (1961/06/20, [id number])  Date of Surgery: 08/07/2020 - 08/08/2020   Preoperative Diagnosis: Acute cholecystitis  Postoperative Diagnosis: Acute gangrenous cholecystitis  Surgical Procedure: LAPAROSCOPIC CHOLECYSTECTOMY:    Operative Team Members:  Surgeon(s) and Role:    * Vinicio Lynk, Hyman Hopes, MD - Primary  Bailey Mech, PA - assistant  Anesthesiologist: Marcene Duos, MD; Bethena Midget, MD CRNA: Basilio Cairo, CRNA; Florene Route, CRNA   Anesthesia: General   Fluids:  Total I/O In: 1247.6 [I.V.:1158.8; IV Piggyback:88.8] Out: 700 [Urine:700]  Complications: None  Drains:  none   Specimen:  ID Type Source Tests Collected by Time Destination  1 : GALLBLADDER GI Gallbladder SURGICAL PATHOLOGY Elaisha Zahniser, Hyman Hopes, MD 08/08/2020 1221      Disposition:  PACU - hemodynamically stable.  Plan of Care: Continued care in stepdown admitted to the medical service.  Plan for discharge tomorrow if vitals and labs look okay.  Okay for diet as tolerated.     Indications for Procedure: Bethany Diaz is a 59 y.o. female who presented with abdominal pain.  History, physical and imaging was concerning for acute cholecystitis.  Laparoscopic cholecystectomy was recommended for the patient.  The procedure itself, as well as the risks, benefits and alternatives were discussed with the patient.  Risks discussed included but were not limited to the risk of infection, bleeding, damage to nearby structures, need to convert to open procedure, incisional hernia, bile leak, common bile duct injury and the need for additional procedures or surgeries.  With this discussion complete and all questions answered the patient granted consent to proceed.  Findings: Gangrenous cholecystitis.  Small umbilical hernia used for the 12 mm trocar and gallbladder extraction site, just closed with 0 vicryl figure of 8 with PMI.  Description of Procedure:   On the date stated above,  the patient was taken to the operating room suite and placed in supine positioning.  Sequential compression devices were placed on the lower extremities to prevent blood clots.  General endotracheal anesthesia was induced. Preoperative antibiotics (piperacillin-tazobactam) were given within 30 minutes of incision.  The patient's abdomen was prepped and draped in the usual sterile fashion.  A time-out was completed verifying the correct patient, procedure, positioning and equipment needed for the case.  We began by anesthetizing the skin with local anesthetic and then making a 12 mm incision just above the umbilicus.  We immediately entered a umbilical hernia defect.  A 12 mm trocar was placed through this defect and the abdomen was insufflated to 15 mmHg.  There was no trauma to the underlying viscera with initial trocar placement.  Three additional trocars were placed, one 5 mm trocar in the subxiphoid position, one 5 mm trocar in the midline epigastric area and one 32mm trocar in the right upper quadrant subcostally.  These were placed under direct vision without any trauma to the underlying viscera.    The patient was then placed in head up, left side down positioning.  The gallbladder was identified and dissected free from its attachments to the omentum allowing the duodenum to fall away.  The infundibulum of the gallbladder was dissected free working laterally to medially.  The cystic duct and cystic artery were dissected free from surrounding connective tissue.  The infundibulum of the gallbladder was dissected off the cystic plate.  A critical view of safety was obtained with the cystic duct and cystic artery being cleared of connective tissues and clearly the only two structures entering into the gallbladder with  the liver clearly visible behind.  Clips were then applied to the cystic duct and cystic artery and then these structures were divided.  The gallbladder was dissected off the cystic plate, placed  in an endocatch bag and removed from the 12 mm subxiphoid port site.  The clips were inspected and appeared effective.  The cystic plate was inspected and hemostasis was obtained using electrocautery.  A suction irrigator was used to clean the operative field.  Attention was turned to closure.  The 12 mm subxiphoid port site was closed using a 0-vicryl suture on a fascial suture passer in a figure of eight fashion.  The abdomen was desufflated.  The skin was closed using 4-0 monocryl and dermabond.  All sponge and needle counts were correct at the conclusion of the case.    Ivar Drape, MD General, Bariatric, & Minimally Invasive Surgery [facility], [place]

## 2020-08-08 NOTE — TOC Progression Note (Signed)
Transition of Care [facility]) - Progression Note    Patient Details  Name: Bethany Diaz MRN: [id number] Date of Birth: Jun 26, 1961  Transition of Care [facility]) CM/SW Contact  Golda Acre, RN Phone Number: 08/08/2020, 2:59 PM  Clinical Narrative:    Spoke with son, patient does not speak [place]. Sone does.  Wondering how he can get a copy of the patient's medicaid card.  *)) numbers and telephone number to [place] social service given to get verification that she does have medicaid benefits and a copy of the card.   Expected Discharge Plan: Home/Self Care Barriers to Discharge: No Barriers Identified  Expected Discharge Plan and Services Expected Discharge Plan: Home/Self Care   Discharge Planning Services: CM Consult   Living arrangements for the past 2 months: Apartment                                       Social Determinants of Health (SDOH) Interventions    Readmission Risk Interventions No flowsheet data found.

## 2020-08-08 NOTE — TOC Initial Note (Signed)
Transition of Care [facility]) - Initial/Assessment Note    Patient Details  Name: Bethany Diaz MRN: [id number] Date of Birth: March 25, 1961  Transition of Care [facility]) CM/SW Contact:    Golda Acre, RN Phone Number: 08/08/2020, 8:47 AM  Clinical Narrative:                 Bethany Diaz is a 59 y.o. female with medical history significant of  DM2, HLD    Presented with   Abdominal pain for the past 2 days unable to eat Nausea and vomiting and fever.  She takes metformin for DM not on any insulin Also report tooth ache that has been for the past 5 days  never smoker does not drink At baseline able to walk a flight of stairs Has no exertional chest pain or chest pain otherwise no dyspnea on exertion  No known hx of CAD Infectious risk factors:  Reports  fever,  N/V/Diarrhea/abdominal pain,     Has NOt been vaccinated against COVID    Initial COVID TEST  NEGATIVE  PLAN: return to home with family support Following for progression. Expected Discharge Plan: Home/Self Care Barriers to Discharge: No Barriers Identified   Patient Goals and CMS Choice Patient states their goals for this hospitalization and ongoing recovery are:: to [facility] CMS Medicare.gov Compare Post Acute Care list provided to:: Patient Choice offered to / list presented to : Patient  Expected Discharge Plan and Services Expected Discharge Plan: Home/Self Care   Discharge Planning Services: CM Consult   Living arrangements for the past 2 months: Apartment                                      Prior Living Arrangements/Services Living arrangements for the past 2 months: Apartment Lives with:: Self Patient language and need for interpreter reviewed:: Yes Do you feel safe going back to the place where you live?: Yes      Need for Family Participation in Patient Care: Yes (Comment) Care giver support system in place?: Yes (comment)   Criminal Activity/Legal Involvement Pertinent to Current  Situation/Hospitalization: No - Comment as needed  Activities of Daily Living Home Assistive Devices/Equipment: Eyeglasses ADL Screening (condition at time of admission) Patient's cognitive ability adequate to safely complete daily activities?: Yes Is the patient deaf or have difficulty hearing?: No Does the patient have difficulty seeing, even when wearing glasses/contacts?: No (eyes water and has to wear glasses to read) Does the patient have difficulty concentrating, remembering, or making decisions?: No Patient able to express need for assistance with ADLs?: Yes Does the patient have difficulty dressing or bathing?: No Independently performs ADLs?: Yes (appropriate for developmental age) Does the patient have difficulty walking or climbing stairs?: No Weakness of Legs: Both Weakness of Arms/Hands: Both  Permission Sought/Granted                  Emotional Assessment Appearance:: Appears stated age Attitude/Demeanor/Rapport: Engaged Affect (typically observed): Calm Orientation: : Oriented to Place,Oriented to Self,Oriented to  Time,Oriented to Situation Alcohol / Substance Use: Not Applicable Psych Involvement: No (comment)  Admission diagnosis:  Cholecystitis [K81.9] Sepsis (HCC) [A41.9] Sepsis, due to unspecified organism, unspecified whether acute organ dysfunction present [facility]) [A41.9] Patient Active Problem List   Diagnosis Date Noted  . Acute calculous cholecystitis 08/07/2020  . Non-English speaking patient ([place] Creole preferred) 08/07/2020  . LFT elevation 08/07/2020  . Recommendation refused by  patient 08/07/2020  . Sepsis (HCC) 08/07/2020  . Hyponatremia 08/07/2020  . Hyperlipidemia 08/07/2020  . Type 2 diabetes mellitus without complication, without long-term current use of insulin (HCC) 09/07/2018   PCP:  Patient, No Pcp Per Pharmacy:   [id number] - [place], [place] - [address] [place] [address] Phone:  [phone] Fax: [phone]     Social Determinants of Health (SDOH) Interventions    Readmission Risk Interventions No flowsheet data found.

## 2020-08-08 NOTE — Anesthesia Preprocedure Evaluation (Signed)
Anesthesia Evaluation  Patient identified by MRN, date of birth, ID band Patient awake    Reviewed: Allergy & Precautions, NPO status , Patient's Chart, lab work & pertinent test results  Airway Mallampati: II  TM Distance: >3 FB Neck ROM: Full    Dental  (+) Dental Advisory Given   Pulmonary neg pulmonary ROS,    breath sounds clear to auscultation       Cardiovascular negative cardio ROS   Rhythm:Regular Rate:Normal     Neuro/Psych negative neurological ROS     GI/Hepatic negative GI ROS, Neg liver ROS,   Endo/Other  diabetes  Renal/GU negative Renal ROS     Musculoskeletal   Abdominal   Peds  Hematology  (+) anemia ,   Anesthesia Other Findings   Reproductive/Obstetrics                             Lab Results  Component Value Date   WBC 13.0 (H) 08/08/2020   HGB 11.6 (L) 08/08/2020   HCT 34.3 (L) 08/08/2020   MCV 91.0 08/08/2020   PLT 129 (L) 08/08/2020   Lab Results  Component Value Date   CREATININE 0.82 08/08/2020   BUN 15 08/08/2020   NA 135 08/08/2020   K 2.7 (LL) 08/08/2020   CL 103 08/08/2020   CO2 22 08/08/2020    Anesthesia Physical Anesthesia Plan  ASA: III  Anesthesia Plan: General   Post-op Pain Management:    Induction: Intravenous  PONV Risk Score and Plan: 3 and Dexamethasone, Ondansetron and Treatment may vary due to age or medical condition  Airway Management Planned: Oral ETT  Additional Equipment: None  Intra-op Plan:   Post-operative Plan: Extubation in OR  Informed Consent: I have reviewed the patients History and Physical, chart, labs and discussed the procedure including the risks, benefits and alternatives for the proposed anesthesia with the patient or authorized representative who has indicated his/her understanding and acceptance.     Dental advisory given  Plan Discussed with:   Anesthesia Plan Comments:          Anesthesia Quick Evaluation

## 2020-08-08 NOTE — Transfer of Care (Signed)
Immediate Anesthesia Transfer of Care Note  Patient: Neurosurgeon  Procedure(s) Performed: LAPAROSCOPIC CHOLECYSTECTOMY (N/A Abdomen)  Patient Location: PACU  Anesthesia Type:General  Level of Consciousness: drowsy  Airway & Oxygen Therapy: Patient Spontanous Breathing and Patient connected to face mask oxygen  Post-op Assessment: Report given to RN and Post -op Vital signs reviewed and stable  Post vital signs: Reviewed and stable  Last Vitals:  Vitals Value Taken Time  BP 133/82 08/08/20 1326  Temp    Pulse 88 08/08/20 1328  Resp 23 08/08/20 1328  SpO2 100 % 08/08/20 1328  Vitals shown include unvalidated device data.  Last Pain:  Vitals:   08/08/20 1104  TempSrc:   PainSc: 0-No pain      Patients Stated Pain Goal: 4 (08/08/20 1104)  Complications: No complications documented.

## 2020-08-08 NOTE — Progress Notes (Signed)
Initial Nutrition Assessment  DOCUMENTATION CODES:   Not applicable  INTERVENTION:  - diet advancement as medically feasible post-op.  NUTRITION DIAGNOSIS:   Inadequate oral intake related to inability to eat as evidenced by NPO status.  GOAL:   Patient will meet greater than or equal to 90% of their needs  MONITOR:   Diet advancement,Labs,Weight trends,Skin  REASON FOR ASSESSMENT:   Malnutrition Screening Tool  ASSESSMENT:   59 year old female with history of type 2 DM and HLD. She presented to the ED with abdominal pain, mainly in the RUQ, with associated N/V and fever for several days. In the ED, she was febrile, septic appearing, and ultrasound showed acute cholecystitis. General surgery was consulted.  Patient has been NPO since admission. She is out of the room to OR and left the room ~50 minutes ago.   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   Weight today is 138 lb, weight yesterday was 137 lb, and the only other weight documented PTA was at [facility] on 09/07/18 when she weighed 135 lb. No information documented in the edema section of flow sheet.   Per notes: - lap chole today - sepsis d/t acute cholangitis   Labs reviewed; CBGs: 122, 87, 76 mg/dl, K: 2.7 mmol/l, Ca: 8.2 mg/dl, Phos: 1.7 mg/dl.  Medications reviewed; sliding scale novolog, 10 mEq IV KCl x4 runs 12/9.    NUTRITION - FOCUSED PHYSICAL EXAM:  unable to complete at this time.   Diet Order:   Diet Order            Diet NPO time specified Except for: Ice Chips, Sips with Meds  Diet effective now                 EDUCATION NEEDS:   Not appropriate for education at this time  Skin:  Skin Assessment: Reviewed RN Assessment  Last BM:  PTA/unknown  Height:   Ht Readings from Last 1 Encounters:  08/08/20 5\' 3"  (1.6 m)    Weight:   Wt Readings from Last 1 Encounters:  08/08/20 60.8 kg    Estimated Nutritional Needs:  Kcal:  14/09/21 kcal Protein:   95-110 grams Fluid:  >/= 2 L/day      [phone], MS, RD, LDN, CNSC Inpatient Clinical Dietitian RD pager # available in [facility]  After hours/weekend pager # available in [facility]

## 2020-08-08 NOTE — Progress Notes (Signed)
PHARMACY - PHYSICIAN COMMUNICATION CRITICAL VALUE ALERT - BLOOD CULTURE IDENTIFICATION (BCID)  Bethany Diaz is an 59 y.o. female who presented to [facility] on 08/07/2020 with a chief complaint of cholecystitis.   Assessment:  Lap chole today. On Zosyn. BCx with strep species (not GBS, not GAS, not S pneumo)  Name of physician (or Provider) Contacted: Gherge  Current antibiotics: Zosyn  Changes to prescribed antibiotics recommended:  Continue Zosyn  Results for orders placed or performed during the hospital encounter of 08/07/20  Blood Culture ID Panel (Reflexed) (Collected: 08/07/2020  4:34 PM)  Result Value Ref Range   Enterococcus faecalis NOT DETECTED NOT DETECTED   Enterococcus Faecium NOT DETECTED NOT DETECTED   Listeria monocytogenes NOT DETECTED NOT DETECTED   Staphylococcus species NOT DETECTED NOT DETECTED   Staphylococcus aureus (BCID) NOT DETECTED NOT DETECTED   Staphylococcus epidermidis NOT DETECTED NOT DETECTED   Staphylococcus lugdunensis NOT DETECTED NOT DETECTED   Streptococcus species DETECTED (A) NOT DETECTED   Streptococcus agalactiae NOT DETECTED NOT DETECTED   Streptococcus pneumoniae NOT DETECTED NOT DETECTED   Streptococcus pyogenes NOT DETECTED NOT DETECTED   A.calcoaceticus-baumannii NOT DETECTED NOT DETECTED   Bacteroides fragilis NOT DETECTED NOT DETECTED   Enterobacterales NOT DETECTED NOT DETECTED   Enterobacter cloacae complex NOT DETECTED NOT DETECTED   Escherichia coli NOT DETECTED NOT DETECTED   Klebsiella aerogenes NOT DETECTED NOT DETECTED   Klebsiella oxytoca NOT DETECTED NOT DETECTED   Klebsiella pneumoniae NOT DETECTED NOT DETECTED   Proteus species NOT DETECTED NOT DETECTED   Salmonella species NOT DETECTED NOT DETECTED   Serratia marcescens NOT DETECTED NOT DETECTED   Haemophilus influenzae NOT DETECTED NOT DETECTED   Neisseria meningitidis NOT DETECTED NOT DETECTED   Pseudomonas aeruginosa NOT DETECTED NOT DETECTED    Stenotrophomonas maltophilia NOT DETECTED NOT DETECTED   Candida albicans NOT DETECTED NOT DETECTED   Candida auris NOT DETECTED NOT DETECTED   Candida glabrata NOT DETECTED NOT DETECTED   Candida krusei NOT DETECTED NOT DETECTED   Candida parapsilosis NOT DETECTED NOT DETECTED   Candida tropicalis NOT DETECTED NOT DETECTED   Cryptococcus neoformans/gattii NOT DETECTED NOT DETECTED    Luisa Hart D 08/08/2020  1:35 PM

## 2020-08-08 NOTE — Progress Notes (Signed)
Progress Note: General Surgery Service   Chief Complaint/Subjective: Met with patient using [place] creole audio translator Continued Right upper quadrant abdominal pain Had many discussions last night with medical providers and son - now agreeable to proceed with laparoscopic cholecystectomy today.    Objective: Vital signs in last 24 hours: Temp:  [98.3 F (36.8 C)-103.5 F (39.7 C)] 98.3 F (36.8 C) (12/09 0800) Pulse Rate:  [78-131] 81 (12/09 0900) Resp:  [13-39] 20 (12/09 0900) BP: (81-142)/(42-108) 95/62 (12/09 0900) SpO2:  [88 %-99 %] 99 % (12/09 0900) Weight:  [62.1 kg-62.5 kg] 62.5 kg (12/09 0049) Last BM Date:  (PTA)  Intake/Output from previous day: 12/08 0701 - 12/09 0700 In: 2441.8 [I.V.:1180; IV Piggyback:1261.8] Out: 700 [Urine:700] Intake/Output this shift: Total I/O In: 247.6 [I.V.:158.8; IV Piggyback:88.8] Out: 700 [Urine:700]  Gen: No acute distress,   Resp: bilateral breath sounds, no increased work of breathing  Card: Regular rate and rhythm, no murmurs, rubs or gallops  Abd: Soft, tender in RUQ, no rebound or guarding  Lab Results: CBC  Recent Labs    08/07/20 1607 08/08/20 0102  WBC 16.6* 13.0*  HGB 13.6 11.6*  HCT 39.9 34.3*  PLT 160 129*   BMET Recent Labs    08/07/20 1607 08/08/20 0102  NA 132* 135  K 3.5 2.7*  CL 98 103  CO2 22 22  GLUCOSE 119* 125*  BUN 21* 15  CREATININE 0.86 0.82  CALCIUM 9.2 8.2*   PT/INR Recent Labs    08/07/20 1645  LABPROT 14.3  INR 1.2   ABG No results for input(s): PHART, HCO3 in the last 72 hours.  Invalid input(s): PCO2, PO2  Anti-infectives: Anti-infectives (From admission, onward)   Start     Dose/Rate Route Frequency Ordered Stop   08/08/20 0130  piperacillin-tazobactam (ZOSYN) IVPB 3.375 g  Status:  Discontinued        3.375 g 12.5 mL/hr over 240 Minutes Intravenous Every 8 hours 08/07/20 2119 08/07/20 2135   08/08/20 0100  piperacillin-tazobactam (ZOSYN) IVPB 3.375 g         3.375 g 12.5 mL/hr over 240 Minutes Intravenous Every 8 hours 08/07/20 2135     08/07/20 2130  ceFEPIme (MAXIPIME) 2 g in sodium chloride 0.9 % 100 mL IVPB  Status:  Discontinued        2 g 200 mL/hr over 30 Minutes Intravenous  Once 08/07/20 2119 08/07/20 2132   08/07/20 1715  piperacillin-tazobactam (ZOSYN) IVPB 3.375 g        3.375 g 100 mL/hr over 30 Minutes Intravenous  Once 08/07/20 1705 08/07/20 1750   08/07/20 1700  cefTRIAXone (ROCEPHIN) 2 g in sodium chloride 0.9 % 100 mL IVPB  Status:  Discontinued        2 g 200 mL/hr over 30 Minutes Intravenous  Once 08/07/20 1647 08/07/20 1705      Medications: Scheduled Meds: . acetaminophen  1,000 mg Oral On Call to OR  . bupivacaine liposome  20 mL Infiltration Once  . celecoxib  200 mg Oral On Call to OR  . Chlorhexidine Gluconate Cloth  6 each Topical Daily  . gabapentin  300 mg Oral On Call to OR  . insulin aspart  0-9 Units Subcutaneous Q4H  . ondansetron (ZOFRAN) IV  4 mg Intravenous Once   Continuous Infusions: . sodium chloride 75 mL/hr (08/08/20 0900)  . lactated ringers Stopped (08/07/20 2357)  . piperacillin-tazobactam (ZOSYN)  IV 3.375 g (08/08/20 0917)   PRN Meds:.acetaminophen **OR** acetaminophen, fentaNYL (  SUBLIMAZE) injection  Assessment/Plan: Ms. Runyan is a 59 year old female who presented with abdominal pain, was found to have cholecystitis, and was at first reluctant to have surgery but now has consented to proceed with laparoscopic cholecystectomy.  I reiterated the risks, benefits and alternatives to surgery.  After a full discussion, the patient had no questions and granted consent to proceed.  We will proceed to OR today.   LOS: 1 day   Felicie Morn, MD [facility]

## 2020-08-09 ENCOUNTER — Encounter (HOSPITAL_COMMUNITY): Payer: Self-pay | Admitting: Surgery

## 2020-08-09 DIAGNOSIS — A409 Streptococcal sepsis, unspecified: Principal | ICD-10-CM

## 2020-08-09 DIAGNOSIS — R7989 Other specified abnormal findings of blood chemistry: Secondary | ICD-10-CM

## 2020-08-09 LAB — COMPREHENSIVE METABOLIC PANEL
ALT: 41 U/L (ref 0–44)
AST: 47 U/L — ABNORMAL HIGH (ref 15–41)
Albumin: 3.3 g/dL — ABNORMAL LOW (ref 3.5–5.0)
Alkaline Phosphatase: 115 U/L (ref 38–126)
Anion gap: 11 (ref 5–15)
BUN: 12 mg/dL (ref 6–20)
CO2: 22 mmol/L (ref 22–32)
Calcium: 8.9 mg/dL (ref 8.9–10.3)
Chloride: 104 mmol/L (ref 98–111)
Creatinine, Ser: 0.57 mg/dL (ref 0.44–1.00)
GFR, Estimated: >60 mL/min (ref >60)
Glucose, Bld: 134 mg/dL — ABNORMAL HIGH (ref 70–99)
Potassium: 3.8 mmol/L (ref 3.5–5.1)
Sodium: 137 mmol/L (ref 135–145)
Total Bilirubin: 1.7 mg/dL — ABNORMAL HIGH (ref 0.3–1.2)
Total Protein: 6.9 g/dL (ref 6.5–8.1)

## 2020-08-09 LAB — CBC
HCT: 33.8 % — ABNORMAL LOW (ref 36.0–46.0)
Hemoglobin: 11 g/dL — ABNORMAL LOW (ref 12.0–15.0)
MCH: 30.3 pg (ref 26.0–34.0)
MCHC: 32.5 g/dL (ref 30.0–36.0)
MCV: 93.1 fL (ref 80.0–100.0)
Platelets: 124 10*3/uL — ABNORMAL LOW (ref 150–400)
RBC: 3.63 MIL/uL — ABNORMAL LOW (ref 3.87–5.11)
RDW: 13.1 % (ref 11.5–15.5)
WBC: 11.3 10*3/uL — ABNORMAL HIGH (ref 4.0–10.5)
nRBC: 0 % (ref 0.0–0.2)

## 2020-08-09 LAB — GLUCOSE, CAPILLARY
Glucose-Capillary: 143 mg/dL — ABNORMAL HIGH (ref 70–99)
Glucose-Capillary: 120 mg/dL — ABNORMAL HIGH (ref 70–99)
Glucose-Capillary: 109 mg/dL — ABNORMAL HIGH (ref 70–99)
Glucose-Capillary: 134 mg/dL — ABNORMAL HIGH (ref 70–99)
Glucose-Capillary: 119 mg/dL — ABNORMAL HIGH (ref 70–99)

## 2020-08-09 LAB — URINE CULTURE: Culture: <10000 — AB

## 2020-08-09 LAB — SURGICAL PATHOLOGY

## 2020-08-09 MED ORDER — SODIUM CHLORIDE 0.9 % IV SOLN
2.0000 g | INTRAVENOUS | Status: DC
  Administered 2020-08-09: 2 g via INTRAVENOUS
  Filled 2020-08-09 (×2): qty 20

## 2020-08-09 NOTE — Discharge Instructions (Signed)
[facility] LAPAROSCOPIC SURGERY: POST OP INSTRUCTIONS Always review your discharge instruction sheet given to you by the facility where your surgery was performed. IF YOU HAVE DISABILITY OR FAMILY LEAVE FORMS, YOU MUST BRING THEM TO THE OFFICE FOR PROCESSING.   DO NOT GIVE THEM TO YOUR DOCTOR.  PAIN CONTROL  1. First take acetaminophen (Tylenol) AND/or ibuprofen (Advil) to control your pain after surgery.  Follow directions on package.  Taking acetaminophen (Tylenol) and/or ibuprofen (Advil) regularly after surgery will help to control your pain and lower the amount of prescription pain medication you may need.  You should not take more than 3,000 mg (3 grams) of acetaminophen (Tylenol) in 24 hours.  You should not take ibuprofen (Advil), aleve, motrin, naprosyn or other NSAIDS if you have a history of stomach ulcers or chronic kidney disease.  2. A prescription for pain medication may be given to you upon discharge.  Take your pain medication as prescribed, if you still have uncontrolled pain after taking acetaminophen (Tylenol) or ibuprofen (Advil). 3. Use ice packs to help control pain. 4. If you need a refill on your pain medication, please contact your pharmacy.  They will contact our office to request authorization. Prescriptions will not be filled after 5pm or on week-ends.  HOME MEDICATIONS 5. Take your usually prescribed medications unless otherwise directed.  DIET 6. You should follow a light diet the first few days after arrival home.  Be sure to include lots of fluids daily. Avoid fatty, fried foods.   CONSTIPATION 7. It is common to experience some constipation after surgery and if you are taking pain medication.  Increasing fluid intake and taking a stool softener (such as Colace) will usually help or prevent this problem from occurring.  A mild laxative (Milk of Magnesia or Miralax) should be taken according to package instructions if there are no bowel  movements after 48 hours.  WOUND/INCISION CARE 8. Most patients will experience some swelling and bruising in the area of the incisions.  Ice packs will help.  Swelling and bruising can take several days to resolve.  9. Unless discharge instructions indicate otherwise, follow guidelines below  a. STERI-STRIPS - you may remove your outer bandages 48 hours after surgery, and you may shower at that time.  You have steri-strips (small skin tapes) in place directly over the incision.  These strips should be left on the skin for 7-10 days.   b. DERMABOND/SKIN GLUE - you may shower in 24 hours.  The glue will flake off over the next 2-3 weeks. 10. Any sutures or staples will be removed at the office during your follow-up visit.  ACTIVITIES 11. You may resume regular (light) daily activities beginning the next day--such as daily self-care, walking, climbing stairs--gradually increasing activities as tolerated.  You may have sexual intercourse when it is comfortable.  Refrain from any heavy lifting or straining until approved by your doctor. a. You may drive when you are no longer taking prescription pain medication, you can comfortably wear a seatbelt, and you can safely maneuver your car and apply brakes.  FOLLOW-UP 12. You should see your doctor in the office for a follow-up appointment approximately 2-3 weeks after your surgery.  You should have been given your post-op/follow-up appointment when your surgery was scheduled.  If you did not receive a post-op/follow-up appointment, make sure that you call for this appointment within a day or two after you arrive home to insure a convenient appointment time.  WHEN   TO CALL YOUR DOCTOR: 1. Fever over 101.0 2. Inability to urinate 3. Continued bleeding from incision. 4. Increased pain, redness, or drainage from the incision. 5. Increasing abdominal pain  The clinic staff is available to answer your questions during regular business hours.  Please don't  hesitate to call and ask to speak to one of the nurses for clinical concerns.  If you have a medical emergency, go to the nearest emergency room or call 911.  A surgeon from [facility] is always on call at the hospital. [address], [address], [place], [place]  [address] ? [address] [address], , [place]   [address] [phone] ? [phone] ? FAX [phone] Web site: [email or web address]  

## 2020-08-09 NOTE — Progress Notes (Signed)
PROGRESS NOTE  Bethany Diaz GBT:[id number] DOB: Mar 26, 1961 DOA: 08/07/2020 PCP: Patient, No Pcp Per   LOS: 2 days   Brief Narrative / Interim history: 59 year old female with history of DM2, HLD, came to the hospital with complaints of abdominal pain, mainly in the right upper quadrant associated with nausea, vomiting and fever.  This has been going on for the past couple of days.  In the ED she was febrile, septic appearing and ultrasound showed acute cholecystitis.  General surgery was consulted.  Initially patient refused admission/surgery and wanted to be discharged home however later changed her mind and she was admitted to the hospital and general surgery was consulted.  Subjective / 24h Interval events: Interview via [place] audio interpreter  Underwent laparoscopic cholecystectomy yesterday.  She is doing much better this morning, minimal abdominal pain, no nausea, no vomiting, able to tolerate diet  Assessment & Plan: Principal Problem Sepsis due to acute cholecystitis, Streptococcus bacteremia-patient met sepsis criteria on admission with fever, tachycardia, tachypnea as well as elevated WBC.  Blood pressure was also soft.  She was placed on intravenous antibiotics with Zosyn as well as IV fluids and general surgery was consulted.   -General surgery consulted, she underwent laparoscopic cholecystectomy on 12/9 -Blood cultures are growing Streptococcus, awaiting final speciation.  Change to ceftriaxone.  Suspect she will need several days of oral antibiotics.  Possibly home tomorrow  Active Problems Diabetes mellitus -It appears that she is on Metformin at home.  Hold that and provide sliding scale while hospitalized.  Sugars well controlled  CBG (last 3)  Recent Labs    08/08/20 2346 08/09/20 0348 08/09/20 0751  GLUCAP 133* 143* 120*    Scheduled Meds: . acetaminophen  650 mg Oral Q6H  . bupivacaine liposome  20 mL Infiltration Once  . Chlorhexidine Gluconate  Cloth  6 each Topical Daily  . gabapentin  300 mg Oral TID  . insulin aspart  0-9 Units Subcutaneous Q4H  . ketorolac  15 mg Intravenous Q8H  . ondansetron (ZOFRAN) IV  4 mg Intravenous Once   Continuous Infusions: . cefTRIAXone (ROCEPHIN)  IV     PRN Meds:.acetaminophen **OR** acetaminophen, HYDROmorphone (DILAUDID) injection, oxyCODONE, oxyCODONE  Diet Orders (From admission, onward)    Start     Ordered   08/09/20 0753  Diet regular Room service appropriate? Yes; Fluid consistency: Thin  Diet effective now       Question Answer Comment  Room service appropriate? Yes   Fluid consistency: Thin      08/09/20 0752          DVT prophylaxis: SCDs Start: 08/08/20 0006 SCD's Start: 08/07/20 2204     Code Status: Full Code  Family Communication: No family at bedside  Status is: Inpatient  Remains inpatient appropriate because:Inpatient level of care appropriate due to severity of illness   Dispo: The patient is from: Home              Anticipated d/c is to: Home              Anticipated d/c date is: 1 day              Patient currently is not medically stable to d/c.  Consultants:  General surgery   Procedures:  Laparoscopic cholecystectomy 12/9  Microbiology  Blood cultures-Streptococcus, 4/4 bottles  Antimicrobials: Zosyn 12/8 >> 12/10 Ceftriaxone 12/10   Objective: Vitals:   08/09/20 0438 08/09/20 0600 08/09/20 0800 08/09/20 0900  BP: 124/71 126/80 118/84  Pulse: (!) 49 (!) 48 (!) 58 (!) 56  Resp: $Remo'17 17 18 17  'SJKqZ$ Temp:   98.5 F (36.9 C)   TempSrc:   Oral   SpO2: 100% 97% 98% 100%  Weight:      Height:        Intake/Output Summary (Last 24 hours) at 08/09/2020 1022 Last data filed at 08/09/2020 0940 Gross per 24 hour  Intake 1700.15 ml  Output 1600 ml  Net 100.15 ml   Filed Weights   08/07/20 2019 08/08/20 0049 08/08/20 1104  Weight: 62.1 kg 62.5 kg 60.8 kg    Examination:  Constitutional: She is in no distress, in bed Eyes: no scleral  icterus ENMT: mmm Neck: normal, supple Respiratory: Lungs are clear bilaterally without wheezing or crackles Cardiovascular: Regular rate and rhythm, no murmurs, no peripheral edema Abdomen: Mild tenderness but no guarding or rebound, bowel sounds positive Musculoskeletal: no clubbing / cyanosis.  Skin: No rashes Neurologic: Nonfocal, equal strength  Data Reviewed: I have independently reviewed following labs and imaging studies   CBC: Recent Labs  Lab 08/07/20 1607 08/08/20 0102 08/09/20 0248  WBC 16.6* 13.0* 11.3*  NEUTROABS  --  10.7*  --   HGB 13.6 11.6* 11.0*  HCT 39.9 34.3* 33.8*  MCV 89.1 91.0 93.1  PLT 160 129* 762*   Basic Metabolic Panel: Recent Labs  Lab 08/07/20 1607 08/08/20 0102 08/09/20 0248  NA 132* 135 137  K 3.5 2.7* 3.8  CL 98 103 104  CO2 $Re'22 22 22  'cOu$ GLUCOSE 119* 125* 134*  BUN 21* 15 12  CREATININE 0.86 0.82 0.57  CALCIUM 9.2 8.2* 8.9  MG 2.3 1.8  --   PHOS  --  1.7*  --    Liver Function Tests: Recent Labs  Lab 08/07/20 1607 08/08/20 0102 08/09/20 0248  AST 36 31 47*  ALT 37 31 41  ALKPHOS 107 99 115  BILITOT 2.8* 2.8* 1.7*  PROT 8.6* 6.9 6.9  ALBUMIN 4.0 3.2* 3.3*   Coagulation Profile: Recent Labs  Lab 08/07/20 1645  INR 1.2   HbA1C: Recent Labs    08/08/20 0102  HGBA1C 5.7*   CBG: Recent Labs  Lab 08/08/20 1554 08/08/20 1934 08/08/20 2346 08/09/20 0348 08/09/20 0751  GLUCAP 157* 173* 133* 143* 120*    Recent Results (from the past 240 hour(s))  Culture, blood (routine x 2)     Status: None (Preliminary result)   Collection Time: 08/07/20  4:34 PM   Specimen: BLOOD LEFT FOREARM  Result Value Ref Range Status   Specimen Description   Final    BLOOD LEFT FOREARM Performed at [facility] [address], [place], [place] [address]    Special Requests   Final    BOTTLES DRAWN AEROBIC AND ANAEROBIC Blood Culture adequate volume   Culture  Setup Time   Final    GRAM POSITIVE COCCI IN  CHAINS IN BOTH AEROBIC AND ANAEROBIC BOTTLES CRITICAL RESULT CALLED TO, READ BACK BY AND VERIFIED WITH: PHARMD Peggyann Juba [id number] FCP    Culture   Final    GRAM POSITIVE COCCI IDENTIFICATION TO FOLLOW Performed at [facility], [place] [address] [place], [place] [address]    Report Status PENDING  Incomplete  Blood Culture ID Panel (Reflexed)     Status: Abnormal   Collection Time: 08/07/20  4:34 PM  Result Value Ref Range Status   Enterococcus faecalis NOT DETECTED NOT DETECTED Final   Enterococcus Faecium NOT DETECTED  NOT DETECTED Final   Listeria monocytogenes NOT DETECTED NOT DETECTED Final   Staphylococcus species NOT DETECTED NOT DETECTED Final   Staphylococcus aureus (BCID) NOT DETECTED NOT DETECTED Final   Staphylococcus epidermidis NOT DETECTED NOT DETECTED Final   Staphylococcus lugdunensis NOT DETECTED NOT DETECTED Final   Streptococcus species DETECTED (A) NOT DETECTED Final    Comment: Not Enterococcus species, Streptococcus agalactiae, Streptococcus pyogenes, or Streptococcus pneumoniae. CRITICAL RESULT CALLED TO, READ BACK BY AND VERIFIED WITH: [place] [id number] FCP    Streptococcus agalactiae NOT DETECTED NOT DETECTED Final   Streptococcus pneumoniae NOT DETECTED NOT DETECTED Final   Streptococcus pyogenes NOT DETECTED NOT DETECTED Final   A.calcoaceticus-baumannii NOT DETECTED NOT DETECTED Final   Bacteroides fragilis NOT DETECTED NOT DETECTED Final   Enterobacterales NOT DETECTED NOT DETECTED Final   Enterobacter cloacae complex NOT DETECTED NOT DETECTED Final   Escherichia coli NOT DETECTED NOT DETECTED Final   Klebsiella aerogenes NOT DETECTED NOT DETECTED Final   Klebsiella oxytoca NOT DETECTED NOT DETECTED Final   Klebsiella pneumoniae NOT DETECTED NOT DETECTED Final   Proteus species NOT DETECTED NOT DETECTED Final   Salmonella species NOT DETECTED NOT DETECTED Final   Serratia marcescens NOT DETECTED NOT DETECTED Final    Haemophilus influenzae NOT DETECTED NOT DETECTED Final   Neisseria meningitidis NOT DETECTED NOT DETECTED Final   Pseudomonas aeruginosa NOT DETECTED NOT DETECTED Final   Stenotrophomonas maltophilia NOT DETECTED NOT DETECTED Final   Candida albicans NOT DETECTED NOT DETECTED Final   Candida auris NOT DETECTED NOT DETECTED Final   Candida glabrata NOT DETECTED NOT DETECTED Final   Candida krusei NOT DETECTED NOT DETECTED Final   Candida parapsilosis NOT DETECTED NOT DETECTED Final   Candida tropicalis NOT DETECTED NOT DETECTED Final   Cryptococcus neoformans/gattii NOT DETECTED NOT DETECTED Final    Comment: Performed at [facility], [address], [place], [place] [address]  Culture, blood (routine x 2)     Status: None (Preliminary result)   Collection Time: 08/07/20  4:36 PM   Specimen: BLOOD  Result Value Ref Range Status   Specimen Description   Final    BLOOD LEFT HAND Performed at [facility] [address], [place], [place] [address]    Special Requests   Final    BOTTLES DRAWN AEROBIC AND ANAEROBIC Blood Culture adequate volume Performed at [facility] [address], [place], [place] [address]    Culture  Setup Time   Final    GRAM POSITIVE COCCI IN CHAINS IN BOTH AEROBIC AND ANAEROBIC BOTTLES CRITICAL VALUE NOTED.  VALUE IS CONSISTENT WITH PREVIOUSLY REPORTED AND CALLED VALUE.    Culture   Final    GRAM POSITIVE COCCI IDENTIFICATION TO FOLLOW Performed at [facility], [place] [address], [place],  [address]    Report Status PENDING  Incomplete  Resp Panel by RT-PCR (Flu A&B, Covid) Nasopharyngeal Swab     Status: None   Collection Time: 08/07/20  4:47 PM   Specimen: Nasopharyngeal Swab; Nasopharyngeal(NP) swabs in vial transport medium  Result Value Ref Range Status   SARS Coronavirus 2 by RT PCR NEGATIVE NEGATIVE Final    Comment: (NOTE) SARS-CoV-2 target nucleic acids are NOT DETECTED.  The SARS-CoV-2  RNA is generally detectable in upper respiratory specimens during the acute phase of infection. The lowest concentration of SARS-CoV-2 viral copies this assay can detect is 138 copies/mL. A negative result does not preclude SARS-Cov-2 infection  and should not be used as the sole basis for treatment or other patient management decisions. A negative result may occur with  improper specimen collection/handling, submission of specimen other than nasopharyngeal swab, presence of viral mutation(s) within the areas targeted by this assay, and inadequate number of viral copies(<138 copies/mL). A negative result must be combined with clinical observations, patient history, and epidemiological information. The expected result is Negative.  Fact Sheet for Patients:  EntrepreneurPulse.com.au  Fact Sheet for Healthcare Providers:  IncredibleEmployment.be  This test is no t yet approved or cleared by the [place] FDA and  has been authorized for detection and/or diagnosis of SARS-CoV-2 by FDA under an Emergency Use Authorization (EUA). This EUA will remain  in effect (meaning this test can be used) for the duration of the COVID-19 declaration under Section 564(b)(1) of the Act, 21 U.S.C.section 360bbb-3(b)(1), unless the authorization is terminated  or revoked sooner.       Influenza A by PCR NEGATIVE NEGATIVE Final   Influenza B by PCR NEGATIVE NEGATIVE Final    Comment: (NOTE) The Xpert Xpress SARS-CoV-2/FLU/RSV plus assay is intended as an aid in the diagnosis of influenza from Nasopharyngeal swab specimens and should not be used as a sole basis for treatment. Nasal washings and aspirates are unacceptable for Xpert Xpress SARS-CoV-2/FLU/RSV testing.  Fact Sheet for Patients: EntrepreneurPulse.com.au  Fact Sheet for Healthcare Providers: IncredibleEmployment.be  This test is not yet approved or cleared by the  [place] FDA and has been authorized for detection and/or diagnosis of SARS-CoV-2 by FDA under an Emergency Use Authorization (EUA). This EUA will remain in effect (meaning this test can be used) for the duration of the COVID-19 declaration under Section 564(b)(1) of the Act, 21 U.S.C. section 360bbb-3(b)(1), unless the authorization is terminated or revoked.  Performed at [facility], [place] [address], [place], [place] [address]   MRSA PCR Screening     Status: None   Collection Time: 08/08/20 12:54 AM   Specimen: Nasopharyngeal  Result Value Ref Range Status   MRSA by PCR NEGATIVE NEGATIVE Final    Comment:        The GeneXpert MRSA Assay (FDA approved for NASAL specimens only), is one component of a comprehensive MRSA colonization surveillance program. It is not intended to diagnose MRSA infection nor to guide or monitor treatment for MRSA infections. Performed at [facility], [place] [address] [place], [place] [address]   Urine culture     Status: Abnormal   Collection Time: 08/08/20  2:00 AM   Specimen: In/Out Cath Urine  Result Value Ref Range Status   Specimen Description   Final    IN/OUT CATH URINE Performed at [facility] [address] [place], [place] [address]    Special Requests   Final    NONE Performed at [facility], [place] [address], [place], [place] [address]    Culture (A)  Final    <10,000 COLONIES/mL INSIGNIFICANT GROWTH Performed at [facility] [address] [place], [place] [address]    Report Status 08/09/2020 FINAL  Final     Radiology Studies: No results found. Marzetta Board, MD, PhD [facility]  Between 7 am - 7 pm I am available, please contact me via Amion or Securechat  Between 7 pm - 7 am I am not available, please contact night coverage MD/APP via Amion

## 2020-08-09 NOTE — Progress Notes (Signed)
Progress Note: General Surgery Service   Chief Complaint/Subjective: Feeling better after cholecystectomy yesterday Some pain around incisions Very thankful for the care she received  Objective: Vital signs in last 24 hours: Temp:  [97.7 F (36.5 C)-98.7 F (37.1 C)] 97.7 F (36.5 C) (12/10 0353) Pulse Rate:  [48-93] 48 (12/10 0600) Resp:  [13-25] 17 (12/10 0600) BP: (93-133)/(53-82) 126/80 (12/10 0600) SpO2:  [96 %-100 %] 97 % (12/10 0600) Weight:  [60.8 kg] 60.8 kg (12/09 1104) Last BM Date:  (PTA)  Intake/Output from previous day: 12/09 0701 - 12/10 0700 In: 1707.8 [P.O.:120; I.V.:1380.2; IV Piggyback:207.6] Out: 2300 [Urine:2250; Blood:50] Intake/Output this shift: No intake/output data recorded.  Gen: No acute distress,   Resp: bilateral breath sounds, no increased work of breathing  Card: Regular rate and rhythm, no murmurs, rubs or gallops  Abd: Soft, tender around incisions, incisions c/d/i w/ glue  Lab Results: CBC  Recent Labs    08/08/20 0102 08/09/20 0248  WBC 13.0* 11.3*  HGB 11.6* 11.0*  HCT 34.3* 33.8*  PLT 129* 124*   BMET Recent Labs    08/08/20 0102 08/09/20 0248  NA 135 137  K 2.7* 3.8  CL 103 104  CO2 22 22  GLUCOSE 125* 134*  BUN 15 12  CREATININE 0.82 0.57  CALCIUM 8.2* 8.9   PT/INR Recent Labs    08/07/20 1645  LABPROT 14.3  INR 1.2   ABG No results for input(s): PHART, HCO3 in the last 72 hours.  Invalid input(s): PCO2, PO2  Anti-infectives: Anti-infectives (From admission, onward)   Start     Dose/Rate Route Frequency Ordered Stop   08/08/20 1700  piperacillin-tazobactam (ZOSYN) IVPB 3.375 g        3.375 g 12.5 mL/hr over 240 Minutes Intravenous Every 8 hours 08/08/20 1645     08/08/20 0130  piperacillin-tazobactam (ZOSYN) IVPB 3.375 g  Status:  Discontinued        3.375 g 12.5 mL/hr over 240 Minutes Intravenous Every 8 hours 08/07/20 2119 08/07/20 2135   08/08/20 0100  piperacillin-tazobactam (ZOSYN) IVPB 3.375  g  Status:  Discontinued        3.375 g 12.5 mL/hr over 240 Minutes Intravenous Every 8 hours 08/07/20 2135 08/08/20 1317   08/07/20 2130  ceFEPIme (MAXIPIME) 2 g in sodium chloride 0.9 % 100 mL IVPB  Status:  Discontinued        2 g 200 mL/hr over 30 Minutes Intravenous  Once 08/07/20 2119 08/07/20 2132   08/07/20 1715  piperacillin-tazobactam (ZOSYN) IVPB 3.375 g        3.375 g 100 mL/hr over 30 Minutes Intravenous  Once 08/07/20 1705 08/07/20 1750   08/07/20 1700  cefTRIAXone (ROCEPHIN) 2 g in sodium chloride 0.9 % 100 mL IVPB  Status:  Discontinued        2 g 200 mL/hr over 30 Minutes Intravenous  Once 08/07/20 1647 08/07/20 1705      Medications: Scheduled Meds: . acetaminophen  650 mg Oral Q6H  . bupivacaine liposome  20 mL Infiltration Once  . Chlorhexidine Gluconate Cloth  6 each Topical Daily  . gabapentin  300 mg Oral TID  . insulin aspart  0-9 Units Subcutaneous Q4H  . ketorolac  15 mg Intravenous Q8H  . ondansetron (ZOFRAN) IV  4 mg Intravenous Once   Continuous Infusions: . piperacillin-tazobactam (ZOSYN)  IV Stopped (08/09/20 0448)   PRN Meds:.acetaminophen **OR** acetaminophen, HYDROmorphone (DILAUDID) injection, oxyCODONE, oxyCODONE  Assessment/Plan: [name] is a 59 year old  female who presented with abdominal pain, was found to have gangrenous cholecystitis, now s/p laparoscopic cholecystectomy on 08/08/20.  Gangrenous cholecystitis s/p sx as above - Okay for regular diet - Okay for discharge home from surgery perspective - Discharge instructions and prescriptions on chart.    LOS: 2 days   Quentin Ore, MD [phone] [facility]

## 2020-08-09 NOTE — TOC Progression Note (Signed)
Transition of Care [facility]) - Progression Note    Patient Details  Name: Bethany Diaz MRN: [id number] Date of Birth: [birth year]  Transition of Care [facility]) CM/SW Contact  Golda Acre, RN Phone Number: 08/09/2020, 7:39 AM  Clinical Narrative:    Date of Surgery: 08/07/2020 - 08/08/2020   Preoperative Diagnosis: Acute cholecystitis  Postoperative Diagnosis: Acute gangrenous cholecystitis  Surgical Procedure: LAPAROSCOPIC CHOLECYSTECTOMY  Pod 1 Plan is to return to home fololwing for progression. Expected Discharge Plan: Home/Self Care Barriers to Discharge: No Barriers Identified  Expected Discharge Plan and Services Expected Discharge Plan: Home/Self Care   Discharge Planning Services: CM Consult   Living arrangements for the past 2 months: Apartment                                       Social Determinants of Health (SDOH) Interventions    Readmission Risk Interventions No flowsheet data found.

## 2020-08-09 NOTE — Progress Notes (Signed)
Inpatient Diabetes Program Recommendations  AACE/ADA: New Consensus Statement on Inpatient Glycemic Control (2015)  Target Ranges:  Prepandial:   less than 140 mg/dL      Peak postprandial:   less than 180 mg/dL (1-2 hours)      Critically ill patients:  140 - 180 mg/dL   Lab Results  Component Value Date   GLUCAP 120 (H) 08/09/2020   HGBA1C 5.7 (H) 08/08/2020    Review of Glycemic Control  Diabetes history: DM2 Outpatient Diabetes medications: metformin 500 mg bid Current orders for Inpatient glycemic control: Novolog 0-9 units Q4H  HgbA1C - 5.7% - pre-diabetes Glucose 134 mg/dL. CBG this am - 120 mg/dL  Inpatient Diabetes Program Recommendations:     Blood sugars trending well. Pt is classified as "pre-diabetes" and will need to f/u yearly with PCP.  Thank you. Ailene Ards, RD, LDN, CDE Inpatient Diabetes Coordinator [phone]

## 2020-08-09 NOTE — Progress Notes (Signed)
PHARMACY - PHYSICIAN COMMUNICATION CRITICAL VALUE ALERT - BLOOD CULTURE IDENTIFICATION (BCID)  Bethany Diaz is an 59 y.o. female who presented to [facility] on 08/07/2020 with a chief complaint of cholecystitis.   Assessment:  S/p Lap chole 12/9, currently on Zosyn. BCx with strep species (not GBS, not GAS, not S pneumo)  Name of physician (or Provider) Contacted: Gherge  Current antibiotics: Zosyn  Changes to prescribed antibiotics recommended:  Change to Ceftriaxone 2g IV q24h  Results for orders placed or performed during the hospital encounter of 08/07/20  Blood Culture ID Panel (Reflexed) (Collected: 08/07/2020  4:34 PM)  Result Value Ref Range   Enterococcus faecalis NOT DETECTED NOT DETECTED   Enterococcus Faecium NOT DETECTED NOT DETECTED   Listeria monocytogenes NOT DETECTED NOT DETECTED   Staphylococcus species NOT DETECTED NOT DETECTED   Staphylococcus aureus (BCID) NOT DETECTED NOT DETECTED   Staphylococcus epidermidis NOT DETECTED NOT DETECTED   Staphylococcus lugdunensis NOT DETECTED NOT DETECTED   Streptococcus species DETECTED (A) NOT DETECTED   Streptococcus agalactiae NOT DETECTED NOT DETECTED   Streptococcus pneumoniae NOT DETECTED NOT DETECTED   Streptococcus pyogenes NOT DETECTED NOT DETECTED   A.calcoaceticus-baumannii NOT DETECTED NOT DETECTED   Bacteroides fragilis NOT DETECTED NOT DETECTED   Enterobacterales NOT DETECTED NOT DETECTED   Enterobacter cloacae complex NOT DETECTED NOT DETECTED   Escherichia coli NOT DETECTED NOT DETECTED   Klebsiella aerogenes NOT DETECTED NOT DETECTED   Klebsiella oxytoca NOT DETECTED NOT DETECTED   Klebsiella pneumoniae NOT DETECTED NOT DETECTED   Proteus species NOT DETECTED NOT DETECTED   Salmonella species NOT DETECTED NOT DETECTED   Serratia marcescens NOT DETECTED NOT DETECTED   Haemophilus influenzae NOT DETECTED NOT DETECTED   Neisseria meningitidis NOT DETECTED NOT DETECTED   Pseudomonas aeruginosa NOT DETECTED  NOT DETECTED   Stenotrophomonas maltophilia NOT DETECTED NOT DETECTED   Candida albicans NOT DETECTED NOT DETECTED   Candida auris NOT DETECTED NOT DETECTED   Candida glabrata NOT DETECTED NOT DETECTED   Candida krusei NOT DETECTED NOT DETECTED   Candida parapsilosis NOT DETECTED NOT DETECTED   Candida tropicalis NOT DETECTED NOT DETECTED   Cryptococcus neoformans/gattii NOT DETECTED NOT DETECTED    Lynann Beaver PharmD, BCPS Clinical Pharmacist [facility] main pharmacy [phone] 08/09/2020 9:47 AM

## 2020-08-09 NOTE — Plan of Care (Signed)
  Problem: Clinical Measurements: Goal: Ability to maintain clinical measurements within normal limits will improve Outcome: Progressing Goal: Will remain free from infection Outcome: Progressing Goal: Diagnostic test results will improve Outcome: Progressing Goal: Cardiovascular complication will be avoided Outcome: Progressing   Problem: Nutrition: Goal: Adequate nutrition will be maintained Outcome: Progressing   Problem: Safety: Goal: Ability to remain free from injury will improve Outcome: Progressing

## 2020-08-10 LAB — CULTURE, BLOOD (ROUTINE X 2)
Special Requests: ADEQUATE
Special Requests: ADEQUATE

## 2020-08-10 LAB — GLUCOSE, CAPILLARY
Glucose-Capillary: 91 mg/dL (ref 70–99)
Glucose-Capillary: 81 mg/dL (ref 70–99)

## 2020-08-10 MED ORDER — LIP MEDEX EX OINT: TOPICAL_OINTMENT | CUTANEOUS | Status: DC | PRN

## 2020-08-10 MED ORDER — AMOXICILLIN-POT CLAVULANATE 875-125 MG PO TABS: 1.0000 | ORAL_TABLET | Freq: Two times a day (BID) | ORAL | 0 refills | Status: AC

## 2020-08-10 NOTE — Plan of Care (Signed)
  Problem: Education: Goal: Knowledge of General Education information will improve Description: Including pain rating scale, medication(s)/side effects and non-pharmacologic comfort measures Outcome: Adequate for Discharge   Problem: Health Behavior/Discharge Planning: Goal: Ability to manage health-related needs will improve Outcome: Adequate for Discharge   Problem: Clinical Measurements: Goal: Ability to maintain clinical measurements within normal limits will improve Outcome: Adequate for Discharge Goal: Will remain free from infection Outcome: Adequate for Discharge Goal: Diagnostic test results will improve Outcome: Adequate for Discharge Goal: Respiratory complications will improve Outcome: Adequate for Discharge Goal: Cardiovascular complication will be avoided Outcome: Adequate for Discharge   Problem: Activity: Goal: Risk for activity intolerance will decrease Outcome: Adequate for Discharge   Problem: Nutrition: Goal: Adequate nutrition will be maintained Outcome: Adequate for Discharge   Problem: Coping: Goal: Level of anxiety will decrease Outcome: Adequate for Discharge   Problem: Elimination: Goal: Will not experience complications related to bowel motility Outcome: Adequate for Discharge Goal: Will not experience complications related to urinary retention Outcome: Adequate for Discharge   Problem: Pain Managment: Goal: General experience of comfort will improve Outcome: Adequate for Discharge   Problem: Safety: Goal: Ability to remain free from injury will improve Outcome: Adequate for Discharge   Problem: Skin Integrity: Goal: Risk for impaired skin integrity will decrease Outcome: Adequate for Discharge   Problem: Education: Goal: Ability to describe self-care measures that may prevent or decrease complications (Diabetes Survival Skills Education) will improve Outcome: Adequate for Discharge Goal: Individualized Educational Video(s) Outcome:  Adequate for Discharge   Problem: Coping: Goal: Ability to adjust to condition or change in health will improve Outcome: Adequate for Discharge   Problem: Fluid Volume: Goal: Ability to maintain a balanced intake and output will improve Outcome: Adequate for Discharge   Problem: Health Behavior/Discharge Planning: Goal: Ability to identify and utilize available resources and services will improve Outcome: Adequate for Discharge Goal: Ability to manage health-related needs will improve Outcome: Adequate for Discharge   Problem: Metabolic: Goal: Ability to maintain appropriate glucose levels will improve Outcome: Adequate for Discharge   Problem: Nutritional: Goal: Maintenance of adequate nutrition will improve Outcome: Adequate for Discharge Goal: Progress toward achieving an optimal weight will improve Outcome: Adequate for Discharge   Problem: Skin Integrity: Goal: Risk for impaired skin integrity will decrease Outcome: Adequate for Discharge   Problem: Tissue Perfusion: Goal: Adequacy of tissue perfusion will improve Outcome: Adequate for Discharge   Problem: Fluid Volume: Goal: Hemodynamic stability will improve Outcome: Adequate for Discharge   Problem: Clinical Measurements: Goal: Diagnostic test results will improve Outcome: Adequate for Discharge Goal: Signs and symptoms of infection will decrease Outcome: Adequate for Discharge   Problem: Respiratory: Goal: Ability to maintain adequate ventilation will improve Outcome: Adequate for Discharge   

## 2020-08-10 NOTE — Discharge Summary (Signed)
Physician Discharge Summary  Bethany Diaz UQJ:[id number] DOB: 18-Aug-1961 DOA: 08/07/2020  PCP: Patient, No Pcp Per  Admit date: 08/07/2020 Discharge date: 08/10/2020  Admitted From: home Disposition:  home  Recommendations for Outpatient Follow-up:  1. Follow up with surgery in 1-2 weeks  Home Health: none Equipment/Devices: none  Discharge Condition: stable CODE STATUS: Full code Diet recommendation: regular  HPI: Per admitting MD, Bethany Diaz is a 59 y.o. female with medical history significant of  DM2, HLD Presented with   Abdominal pain for the past 2 days unable to eat Nausea and vomiting and fever. She takes metformin for DM not on any insulin Also report tooth ache that has been for the past 5 days  never smoker does not drink At baseline able to walk a flight of stairs Has no exertional chest pain or chest pain otherwise no dyspnea on exertion  Hospital Course / Discharge diagnoses: Principal Problem Sepsis due to acute cholecystitis, Streptococcus bacteremia -patient met sepsis criteria on admission with fever, tachycardia, tachypnea as well as elevated WBC.  Blood pressure was also soft.  She was placed on intravenous antibiotics with Zosyn as well as IV fluids and general surgery was consulted.  She was taken to the OR on 4/9 and is status post laparoscopic cholecystectomy.  Postoperatively she recovered well, all her vital signs have normalized, she is able to tolerate a regular diet.  Blood cultures speciated Streptococcus anginosus which is sensitive to penicillin.  Her IV antibiotics were transitioned to amoxicillin and she will be discharged home in stable condition with oral antibiotics for another week.  Case was briefly discussed with Dr. Linus Salmons with infectious disease.  She will have follow-up with general surgery in 1 to 2 weeks  Active Problems Diabetes mellitus -resume home medications on discharge Anemia, thrombocytopenia-likely in the setting of acute  illness, sepsis.  Recommend repeat CBC as an outpatient in PCPs office in a few weeks  Discharge Instructions   Allergies as of 08/10/2020      Reactions   Other    Pt states butter, oil and salt makes her feet sweat and itch on the bottom and her toes tender via [place] interpreter.      Medication List    TAKE these medications   acetaminophen 500 MG tablet Commonly known as: TYLENOL Take 2 tablets (1,000 mg total) by mouth every 6 (six) hours as needed. What changed: reasons to take this   amoxicillin-clavulanate 875-125 MG tablet Commonly known as: Augmentin Take 1 tablet by mouth 2 (two) times daily for 7 days.   HYDROcodone-acetaminophen 5-325 MG tablet Commonly known as: NORCO/VICODIN Take 1 tablet by mouth every 4 (four) hours as needed for severe pain.   ibuprofen 800 MG tablet Commonly known as: ADVIL Take 1 tablet (800 mg total) by mouth every 8 (eight) hours for 7 days.   metFORMIN 500 MG tablet Commonly known as: GLUCOPHAGE Take 500 mg by mouth 2 (two) times daily with a meal.       Follow-up Information    [facility], [facility]. Go on 08/29/2020.   Specialty: General Surgery Why: 2:30 PM. Please arrive 30 min prior to appointment time. Bring photo ID and insurance information.  Contact information: [address] [place] [address] [phone]               Consultations:  General surgery  Procedures/Studies:  Laparoscopic cholecystectomy  DG Chest Port 1 View  Result Date: 08/07/2020 CLINICAL DATA:  Questionable  sepsis, evaluate for abnormality. EXAM: PORTABLE CHEST 1 VIEW COMPARISON:  None. FINDINGS: Borderline enlargement the cardiac silhouette. Linear opacity at the lateral right lung base. Otherwise, no consolidation. No visible pleural effusions or pneumothorax. No acute osseous abnormality. IMPRESSION: Linear opacity at the lateral right lung base, favor atelectasis. Electronically Signed   By: Margaretha Sheffield MD    On: 08/07/2020 18:36   US Abdomen Limited RUQ (LIVER/GB)  Result Date: 08/07/2020 CLINICAL DATA:  Right upper quadrant abdominal pain. EXAM: ULTRASOUND ABDOMEN LIMITED RIGHT UPPER QUADRANT COMPARISON:  None. FINDINGS: Gallbladder: Gallbladder wall thickening. Positive Murphy sign per the technologist. Multiple gallstones, measuring up to 1.2 cm. There may be a small amount of pericholecystic fluid. Common bile duct: Diameter: 2.7 mm, within normal limits. Liver: No focal lesion identified. Within normal limits in parenchymal echogenicity. Portal vein is patent on color Doppler imaging with normal direction of blood flow towards the liver. IMPRESSION: Findings concerning for acute cholecystitis, including gallbladder wall thickening, gallstones, and positive Murphy sign. These results will be called to the ordering clinician or representative by the Radiologist Assistant, and communication documented in the PACS or [employer]. Electronically Signed   By: Margaretha Sheffield MD   On: 08/07/2020 15:39     Subjective: - no chest pain, shortness of breath, no abdominal pain, nausea or vomiting.   Discharge Exam: BP 119/86 (BP Location: Right Arm)   Pulse (!) 55   Temp 99 F (37.2 C) (Oral)   Resp 16   Ht $R'5\' 3"'Pu$  (1.6 m)   Wt 60.8 kg   SpO2 97%   BMI 23.74 kg/m   General: Pt is alert, awake, not in acute distress Cardiovascular: RRR, S1/S2 +, no rubs, no gallops Respiratory: CTA bilaterally, no wheezing, no rhonchi Abdominal: Soft, NT, ND, bowel sounds +   The results of significant diagnostics from this hospitalization (including imaging, microbiology, ancillary and laboratory) are listed below for reference.     Microbiology: Recent Results (from the past 240 hour(s))  Culture, blood (routine x 2)     Status: Abnormal   Collection Time: 08/07/20  4:34 PM   Specimen: BLOOD LEFT FOREARM  Result Value Ref Range Status   Specimen Description BLOOD LEFT FOREARM  Final   Special  Requests   Final    BOTTLES DRAWN AEROBIC AND ANAEROBIC Blood Culture adequate volume   Culture  Setup Time   Final    GRAM POSITIVE COCCI IN CHAINS IN BOTH AEROBIC AND ANAEROBIC BOTTLES CRITICAL RESULT CALLED TO, READ BACK BY AND VERIFIED WITH: PHARMD Peggyann Juba [id number] FCP    Culture STREPTOCOCCUS ANGINOSIS (A)  Final   Report Status 08/10/2020 FINAL  Final   Organism ID, Bacteria STREPTOCOCCUS ANGINOSIS  Final      Susceptibility   Streptococcus anginosis - MIC*    PENICILLIN <=0.06 SENSITIVE Sensitive     CEFTRIAXONE 0.25 SENSITIVE Sensitive     ERYTHROMYCIN <=0.12 SENSITIVE Sensitive     LEVOFLOXACIN 0.5 SENSITIVE Sensitive     VANCOMYCIN 0.25 SENSITIVE Sensitive     * STREPTOCOCCUS ANGINOSIS  Blood Culture ID Panel (Reflexed)     Status: Abnormal   Collection Time: 08/07/20  4:34 PM  Result Value Ref Range Status   Enterococcus faecalis NOT DETECTED NOT DETECTED Final   Enterococcus Faecium NOT DETECTED NOT DETECTED Final   Listeria monocytogenes NOT DETECTED NOT DETECTED Final   Staphylococcus species NOT DETECTED NOT DETECTED Final   Staphylococcus aureus (BCID) NOT DETECTED NOT DETECTED Final  Staphylococcus epidermidis NOT DETECTED NOT DETECTED Final   Staphylococcus lugdunensis NOT DETECTED NOT DETECTED Final   Streptococcus species DETECTED (A) NOT DETECTED Final    Comment: Not Enterococcus species, Streptococcus agalactiae, Streptococcus pyogenes, or Streptococcus pneumoniae. CRITICAL RESULT CALLED TO, READ BACK BY AND VERIFIED WITH: Rhodes [id number] FCP    Streptococcus agalactiae NOT DETECTED NOT DETECTED Final   Streptococcus pneumoniae NOT DETECTED NOT DETECTED Final   Streptococcus pyogenes NOT DETECTED NOT DETECTED Final   A.calcoaceticus-baumannii NOT DETECTED NOT DETECTED Final   Bacteroides fragilis NOT DETECTED NOT DETECTED Final   Enterobacterales NOT DETECTED NOT DETECTED Final   Enterobacter cloacae complex NOT DETECTED NOT  DETECTED Final   Escherichia coli NOT DETECTED NOT DETECTED Final   Klebsiella aerogenes NOT DETECTED NOT DETECTED Final   Klebsiella oxytoca NOT DETECTED NOT DETECTED Final   Klebsiella pneumoniae NOT DETECTED NOT DETECTED Final   Proteus species NOT DETECTED NOT DETECTED Final   Salmonella species NOT DETECTED NOT DETECTED Final   Serratia marcescens NOT DETECTED NOT DETECTED Final   Haemophilus influenzae NOT DETECTED NOT DETECTED Final   Neisseria meningitidis NOT DETECTED NOT DETECTED Final   Pseudomonas aeruginosa NOT DETECTED NOT DETECTED Final   Stenotrophomonas maltophilia NOT DETECTED NOT DETECTED Final   Candida albicans NOT DETECTED NOT DETECTED Final   Candida auris NOT DETECTED NOT DETECTED Final   Candida glabrata NOT DETECTED NOT DETECTED Final   Candida krusei NOT DETECTED NOT DETECTED Final   Candida parapsilosis NOT DETECTED NOT DETECTED Final   Candida tropicalis NOT DETECTED NOT DETECTED Final   Cryptococcus neoformans/gattii NOT DETECTED NOT DETECTED Final    Comment: Performed at [facility], [address], [place], [place] [address]  Culture, blood (routine x 2)     Status: Abnormal   Collection Time: 08/07/20  4:36 PM   Specimen: BLOOD  Result Value Ref Range Status   Specimen Description   Final    BLOOD LEFT HAND Performed at [facility] [address], [place], [place] [address]    Special Requests   Final    BOTTLES DRAWN AEROBIC AND ANAEROBIC Blood Culture adequate volume Performed at [facility] [address] [place], [place] [address]    Culture  Setup Time   Final    GRAM POSITIVE COCCI IN CHAINS IN BOTH AEROBIC AND ANAEROBIC BOTTLES CRITICAL VALUE NOTED.  VALUE IS CONSISTENT WITH PREVIOUSLY REPORTED AND CALLED VALUE.    Culture (A)  Final    STREPTOCOCCUS ANGINOSIS SUSCEPTIBILITIES PERFORMED ON PREVIOUS CULTURE WITHIN THE LAST 5 DAYS. Performed at [facility], [place] [address]  [place], [place] [address]    Report Status 08/10/2020 FINAL  Final  Resp Panel by RT-PCR (Flu A&B, Covid) Nasopharyngeal Swab     Status: None   Collection Time: 08/07/20  4:47 PM   Specimen: Nasopharyngeal Swab; Nasopharyngeal(NP) swabs in vial transport medium  Result Value Ref Range Status   SARS Coronavirus 2 by RT PCR NEGATIVE NEGATIVE Final    Comment: (NOTE) SARS-CoV-2 target nucleic acids are NOT DETECTED.  The SARS-CoV-2 RNA is generally detectable in upper respiratory specimens during the acute phase of infection. The lowest concentration of SARS-CoV-2 viral copies this assay can detect is 138 copies/mL. A negative result does not preclude SARS-Cov-2 infection and should not be used as the sole basis for treatment or other patient management decisions. A negative result may occur with  improper specimen collection/handling, submission of  specimen other than nasopharyngeal swab, presence of viral mutation(s) within the areas targeted by this assay, and inadequate number of viral copies(<138 copies/mL). A negative result must be combined with clinical observations, patient history, and epidemiological information. The expected result is Negative.  Fact Sheet for Patients:  EntrepreneurPulse.com.au  Fact Sheet for Healthcare Providers:  IncredibleEmployment.be  This test is no t yet approved or cleared by the [place] FDA and  has been authorized for detection and/or diagnosis of SARS-CoV-2 by FDA under an Emergency Use Authorization (EUA). This EUA will remain  in effect (meaning this test can be used) for the duration of the COVID-19 declaration under Section 564(b)(1) of the Act, 21 U.S.C.section 360bbb-3(b)(1), unless the authorization is terminated  or revoked sooner.       Influenza A by PCR NEGATIVE NEGATIVE Final   Influenza B by PCR NEGATIVE NEGATIVE Final    Comment: (NOTE) The Xpert Xpress SARS-CoV-2/FLU/RSV plus assay is  intended as an aid in the diagnosis of influenza from Nasopharyngeal swab specimens and should not be used as a sole basis for treatment. Nasal washings and aspirates are unacceptable for Xpert Xpress SARS-CoV-2/FLU/RSV testing.  Fact Sheet for Patients: EntrepreneurPulse.com.au  Fact Sheet for Healthcare Providers: IncredibleEmployment.be  This test is not yet approved or cleared by the [place] FDA and has been authorized for detection and/or diagnosis of SARS-CoV-2 by FDA under an Emergency Use Authorization (EUA). This EUA will remain in effect (meaning this test can be used) for the duration of the COVID-19 declaration under Section 564(b)(1) of the Act, 21 U.S.C. section 360bbb-3(b)(1), unless the authorization is terminated or revoked.  Performed at [facility], [place] [address] [place], [place] [address]   MRSA PCR Screening     Status: None   Collection Time: 08/08/20 12:54 AM   Specimen: Nasopharyngeal  Result Value Ref Range Status   MRSA by PCR NEGATIVE NEGATIVE Final    Comment:        The GeneXpert MRSA Assay (FDA approved for NASAL specimens only), is one component of a comprehensive MRSA colonization surveillance program. It is not intended to diagnose MRSA infection nor to guide or monitor treatment for MRSA infections. Performed at [facility], [place] [address], [place], [place] [address]   Urine culture     Status: Abnormal   Collection Time: 08/08/20  2:00 AM   Specimen: In/Out Cath Urine  Result Value Ref Range Status   Specimen Description   Final    IN/OUT CATH URINE Performed at [facility] [address] [place], [place] [address]    Special Requests   Final    NONE Performed at [facility], [place] [address], [place], [place] [address]    Culture (A)  Final    <10,000 COLONIES/mL INSIGNIFICANT GROWTH Performed at [facility] [address], [place],  [address]    Report Status 08/09/2020 FINAL  Final     Labs: Basic Metabolic Panel: Recent Labs  Lab 08/07/20 1607 08/08/20 0102 08/09/20 0248  NA 132* 135 137  K 3.5 2.7* 3.8  CL 98 103 104  CO2 $Re'22 22 22  'gAh$ GLUCOSE 119* 125* 134*  BUN 21* 15 12  CREATININE 0.86 0.82 0.57  CALCIUM 9.2 8.2* 8.9  MG 2.3 1.8  --   PHOS  --  1.7*  --    Liver Function Tests: Recent Labs  Lab 08/07/20 1607 08/08/20 0102 08/09/20 0248  AST  36 31 47*  ALT 37 31 41  ALKPHOS 107 99 115  BILITOT 2.8* 2.8* 1.7*  PROT 8.6* 6.9 6.9  ALBUMIN 4.0 3.2* 3.3*   CBC: Recent Labs  Lab 08/07/20 1607 08/08/20 0102 08/09/20 0248  WBC 16.6* 13.0* 11.3*  NEUTROABS  --  10.7*  --   HGB 13.6 11.6* 11.0*  HCT 39.9 34.3* 33.8*  MCV 89.1 91.0 93.1  PLT 160 129* 124*   CBG: Recent Labs  Lab 08/09/20 0751 08/09/20 1150 08/09/20 1635 08/09/20 2028 08/10/20 0724  GLUCAP 120* 109* 134* 119* 91   Hgb A1c Recent Labs    08/08/20 0102  HGBA1C 5.7*   Lipid Profile No results for input(s): CHOL, HDL, LDLCALC, TRIG, CHOLHDL, LDLDIRECT in the last 72 hours. Thyroid function studies Recent Labs    08/08/20 0102  TSH 0.564   Urinalysis    Component Value Date/Time   COLORURINE YELLOW 08/08/2020 0200   APPEARANCEUR CLEAR 08/08/2020 0200   LABSPEC 1.011 08/08/2020 0200   PHURINE 6.0 08/08/2020 0200   GLUCOSEU NEGATIVE 08/08/2020 0200   HGBUR SMALL (A) 08/08/2020 0200   BILIRUBINUR NEGATIVE 08/08/2020 0200   KETONESUR 20 (A) 08/08/2020 0200   PROTEINUR NEGATIVE 08/08/2020 0200   NITRITE NEGATIVE 08/08/2020 0200   LEUKOCYTESUR SMALL (A) 08/08/2020 0200    FURTHER DISCHARGE INSTRUCTIONS:   Get Medicines reviewed and adjusted: Please take all your medications with you for your next visit with your Primary MD   Laboratory/radiological data: Please request your Primary MD to go over all hospital tests and procedure/radiological results at the follow  up, please ask your Primary MD to get all Hospital records sent to his/her office.   In some cases, they will be blood work, cultures and biopsy results pending at the time of your discharge. Please request that your primary care M.D. goes through all the records of your hospital data and follows up on these results.   Also Note the following: If you experience worsening of your admission symptoms, develop shortness of breath, life threatening emergency, suicidal or homicidal thoughts you must seek medical attention immediately by calling 911 or calling your MD immediately  if symptoms less severe.   You must read complete instructions/literature along with all the possible adverse reactions/side effects for all the Medicines you take and that have been prescribed to you. Take any new Medicines after you have completely understood and accpet all the possible adverse reactions/side effects.    Do not drive when taking Pain medications or sleeping medications (Benzodaizepines)   Do not take more than prescribed Pain, Sleep and Anxiety Medications. It is not advisable to combine anxiety,sleep and pain medications without talking with your primary care practitioner   Special Instructions: If you have smoked or chewed Tobacco  in the last 2 yrs please stop smoking, stop any regular Alcohol  and or any Recreational drug use.   Wear Seat belts while driving.   Please note: You were cared for by a hospitalist during your hospital stay. Once you are discharged, your primary care physician will handle any further medical issues. Please note that NO REFILLS for any discharge medications will be authorized once you are discharged, as it is imperative that you return to your primary care physician (or establish a relationship with a primary care physician if you do not have one) for your post hospital discharge needs so that they can reassess your need for medications and monitor your lab values.  Time  coordinating discharge: 80  minutes  SIGNED:  Marzetta Board, MD, PhD 08/10/2020, 10:51 AM

## 2020-08-10 NOTE — Progress Notes (Signed)
Patients son went over discharge instruction and interrupted to patient at bedside. Patient had no questions.

## 2020-08-18 NOTE — Anesthesia Postprocedure Evaluation (Signed)
Anesthesia Post Note  Patient: Neurosurgeon  Procedure(s) Performed: LAPAROSCOPIC CHOLECYSTECTOMY (N/A Abdomen)     Patient location during evaluation: PACU Anesthesia Type: General Level of consciousness: awake and alert Pain management: pain level controlled Vital Signs Assessment: post-procedure vital signs reviewed and stable Respiratory status: spontaneous breathing, nonlabored ventilation, respiratory function stable and patient connected to nasal cannula oxygen Cardiovascular status: blood pressure returned to baseline and stable Postop Assessment: no apparent nausea or vomiting Anesthetic complications: no   No complications documented.  Last Vitals:  Vitals:   08/10/20 0524 08/10/20 1116  BP: 119/86 112/90  Pulse: (!) 55 (!) 55  Resp: 16 16  Temp: 37.2 C 37.5 C  SpO2: 97% 98%    Last Pain:  Vitals:   08/10/20 1116  TempSrc: Oral  PainSc:                  Bethany Diaz

## 2021-08-12 ENCOUNTER — Ambulatory Visit
Admission: RE | Admit: 2021-08-12 | Discharge: 2021-08-12 | Disposition: A | Discharge status: Home / Self Care | Payer: Medicaid Other | Source: Ambulatory Visit | Attending: Emergency Medicine | Admitting: Emergency Medicine

## 2021-08-12 ENCOUNTER — Other Ambulatory Visit: Payer: Self-pay

## 2021-08-12 VITALS — BP 122/82 | HR 112 | Temp 98.6°F | Resp 18

## 2021-08-12 DIAGNOSIS — R051 Acute cough: Secondary | ICD-10-CM

## 2021-08-12 DIAGNOSIS — B9789 Other viral agents as the cause of diseases classified elsewhere: Secondary | ICD-10-CM

## 2021-08-12 DIAGNOSIS — J988 Other specified respiratory disorders: Secondary | ICD-10-CM

## 2021-08-12 DIAGNOSIS — R509 Fever, unspecified: Secondary | ICD-10-CM

## 2021-08-12 MED ORDER — OSELTAMIVIR PHOSPHATE 75 MG PO CAPS: 75.0000 mg | ORAL_CAPSULE | Freq: Two times a day (BID) | ORAL | 0 refills | Status: DC

## 2021-08-12 NOTE — ED Triage Notes (Signed)
Pt c/o having a cough and fever since Sunday.

## 2021-08-12 NOTE — ED Provider Notes (Signed)
[facility]    CSN: [id number] Arrival date & time: 08/12/21  0950    HISTORY  No chief complaint on file.  HPI Bethany Diaz is a 60 y.o. female. Patient reports a 3-day history of cough and fever.  Patient states the cough is mildly productive of mostly just causes her chest to feel tight.  Patient states she has not checked her temperature, does not know her T-max.  Patient states he is also had body aches and fatigue.  Patient denies known sick contacts.  Patient states she is not tried any medications to relieve her symptoms.  Patient is tachycardic on arrival, afebrile with mildly decreased oxygen saturation.  The history is provided by the patient. The history is limited by a language barrier. A language interpreter was used ([place], [place]).  Past Medical History:  Diagnosis Date   Diabetes mellitus without complication [facility])    Patient Active Problem List   Diagnosis Date Noted   Acute calculous cholecystitis 08/07/2020   Non-English speaking patient ([place] Creole preferred) 08/07/2020   LFT elevation 08/07/2020   Recommendation refused by patient 08/07/2020   Sepsis (HCC) 08/07/2020   Hyponatremia 08/07/2020   Hyperlipidemia 08/07/2020   Type 2 diabetes mellitus without complication, without long-term current use of insulin (HCC) 09/07/2018   Past Surgical History:  Procedure Laterality Date   CHOLECYSTECTOMY N/A 08/08/2020   Procedure: LAPAROSCOPIC CHOLECYSTECTOMY;  Surgeon: Quentin Ore, MD;  Location: [facility];  Service: General;  Laterality: N/A;   NO PAST SURGERIES     OB History   No obstetric history on file.    Home Medications    Prior to Admission medications   Medication Sig Start Date End Date Taking? Authorizing Provider  metFORMIN (GLUCOPHAGE) 500 MG tablet Take 500 mg by mouth 2 (two) times daily with a meal.  10/10/19   [provider]   Family History Family History  Problem Relation Age of Onset    Diabetes Neg Hx    Social History Social History   Tobacco Use   Smoking status: Never   Smokeless tobacco: Never  Vaping Use   Vaping Use: Never used  Substance Use Topics   Alcohol use: Never   Drug use: Never   Allergies   Other  Review of Systems Review of Systems Pertinent findings noted in history of present illness.   Physical Exam Triage Vital Signs ED Triage Vitals  Enc Vitals Group     BP 06/27/21 0827 (!) 147/82     Pulse Rate 06/27/21 0827 72     Resp 06/27/21 0827 18     Temp 06/27/21 0827 98.3 F (36.8 C)     Temp Source 06/27/21 0827 Oral     SpO2 06/27/21 0827 98 %     Weight --      Height --      Head Circumference --      Peak Flow --      Pain Score 06/27/21 0826 5     Pain Loc --      Pain Edu? --      Excl. in GC? --   No data found.  Updated Vital Signs BP 122/82 (BP Location: Left Arm)    Pulse (!) 112    Temp 98.6 F (37 C) (Oral)    Resp 18    SpO2 95%   Physical Exam Constitutional:      Appearance: She is ill-appearing.  HENT:     Head: Normocephalic  and atraumatic.     Salivary Glands: Right salivary gland is not diffusely enlarged or tender. Left salivary gland is not diffusely enlarged or tender.     Right Ear: Tympanic membrane, ear canal and external ear normal.     Left Ear: Tympanic membrane, ear canal and external ear normal.     Nose: Congestion and rhinorrhea present. Rhinorrhea is clear.     Right Sinus: No maxillary sinus tenderness or frontal sinus tenderness.     Left Sinus: No maxillary sinus tenderness.     Mouth/Throat:     Mouth: Mucous membranes are moist.     Pharynx: Pharyngeal swelling, posterior oropharyngeal erythema and uvula swelling present.     Tonsils: No tonsillar exudate. 0 on the right. 0 on the left.  Cardiovascular:     Rate and Rhythm: Normal rate and regular rhythm.     Pulses: Normal pulses.  Pulmonary:     Effort: Pulmonary effort is normal. No accessory muscle usage, prolonged  expiration or respiratory distress.     Breath sounds: No stridor. No wheezing, rhonchi or rales.     Comments: Turbulent breath sounds throughout without wheeze, rale, rhonchi.  Mild bronchospasm with cough Abdominal:     General: Abdomen is flat. Bowel sounds are normal.     Palpations: Abdomen is soft.  Musculoskeletal:        General: Normal range of motion.  Lymphadenopathy:     Cervical: Cervical adenopathy present.     Right cervical: Superficial cervical adenopathy and posterior cervical adenopathy present.     Left cervical: Superficial cervical adenopathy and posterior cervical adenopathy present.  Skin:    General: Skin is warm and dry.  Neurological:     General: No focal deficit present.     Mental Status: She is alert and oriented to person, place, and time.     Motor: Motor function is intact.     Coordination: Coordination is intact.     Gait: Gait is intact.     Deep Tendon Reflexes: Reflexes are normal and symmetric.  Psychiatric:        Attention and Perception: Attention and perception normal.        Mood and Affect: Mood and affect normal.        Speech: Speech normal.        Behavior: Behavior normal. Behavior is cooperative.        Thought Content: Thought content normal.    Visual Acuity Right Eye Distance:   Left Eye Distance:   Bilateral Distance:    Right Eye Near:   Left Eye Near:    Bilateral Near:     UC Couse / Diagnostics / Procedures:    EKG  Radiology No results found.  Procedures Procedures (including critical care time)  UC Diagnoses / Final Clinical Impressions(s)   I have reviewed the triage vital signs and the nursing notes.  Pertinent labs & imaging results that were available during my care of the patient were reviewed by me and considered in my medical decision making (see chart for details).   Final diagnoses:  Viral respiratory infection  Acute cough  Fever, unspecified fever cause   Physical exam findings as well as  rapid onset of acute symptoms are concerning for influenza infection, patient advised to begin Tamiflu while she awaits the results of her influenza test, rapid influenza test could not be performed today due to national shortage of supplies.  Return precautions advised.  Prescription sent to  pharmacy.  ED Prescriptions     Medication Sig Dispense Auth. Provider   oseltamivir (TAMIFLU) 75 MG capsule Take 1 capsule (75 mg total) by mouth every 12 (twelve) hours. 10 capsule Lynden Oxford Scales, PA-C      PDMP not reviewed this encounter.  Pending results:  Labs Reviewed  COVID-19, FLU A+B NAA    Medications Ordered in UC: Medications - No data to display  Disposition Upon Discharge:  Condition: stable for discharge home Home: take medications as prescribed; routine discharge instructions as discussed; follow up as advised.  Patient presented with an acute illness with associated systemic symptoms and significant discomfort requiring urgent management. In my opinion, this is a condition that a prudent lay person (someone who possesses an average knowledge of health and medicine) may potentially expect to result in complications if not addressed urgently such as respiratory distress, impairment of bodily function or dysfunction of bodily organs.   Routine symptom specific, illness specific and/or disease specific instructions were discussed with the patient and/or caregiver at length.   As such, the patient has been evaluated and assessed, work-up was performed and treatment was provided in alignment with urgent care protocols and evidence based medicine.  Patient/parent/caregiver has been advised that the patient may require follow up for further testing and treatment if the symptoms continue in spite of treatment, as clinically indicated and appropriate.  The patient was tested for COVID-19, Influenza and/or RSV, then the patient/parent/guardian was advised to isolate at home pending  the results of his/her diagnostic coronavirus test and potentially longer if theyre positive. I have also advised pt that if his/her COVID-19 test returns positive, it's recommended to self-isolate for at least 10 days after symptoms first appeared AND until fever-free for 24 hours without fever reducer AND other symptoms have improved or resolved. Discussed self-isolation recommendations as well as instructions for household member/close contacts as per the [facility] and [place] DHHS, and also gave patient the [place] packet with this information.  Patient/parent/caregiver has been advised to return to the [facility] or PCP in 3-5 days if no better; to PCP or the Emergency Department if new signs and symptoms develop, or if the current signs or symptoms continue to change or worsen for further workup, evaluation and treatment as clinically indicated and appropriate  The patient will follow up with their current PCP if and as advised. If the patient does not currently have a PCP we will assist them in obtaining one.   The patient may need specialty follow up if the symptoms continue, in spite of conservative treatment and management, for further workup, evaluation, consultation and treatment as clinically indicated and appropriate.  Patient/parent/caregiver verbalized understanding and agreement of plan as discussed.  All questions were addressed during visit.  Please see discharge instructions below for further details of plan.  Discharge Instructions:   Discharge Instructions      Please begin Tamiflu now, 1 capsule twice daily for 5 days, while we wait for the results of your influenza test this may take 24 to 48 hours.  Please give the pharmacy about 1 hour to have this filled for you.  You can try to take the first 2 doses today, approximately 6 to 8 hours apart, that would be best.  The results of your flu test will be posted to your MyChart once it is complete, if it is positive, you will be contacted by  phone.  Thank you for visiting urgent care today, I hope you feel better soon.  Lynden Oxford Scales, PA-C 08/12/21 1054

## 2021-08-12 NOTE — Discharge Instructions (Addendum)
Please begin Tamiflu now, 1 capsule twice daily for 5 days, while we wait for the results of your influenza test this may take 24 to 48 hours.  Please give the pharmacy about 1 hour to have this filled for you.  You can try to take the first 2 doses today, approximately 6 to 8 hours apart, that would be best.  The results of your flu test will be posted to your MyChart once it is complete, if it is positive, you will be contacted by phone.  Thank you for visiting urgent care today, I hope you feel better soon.

## 2021-08-13 LAB — COVID-19, FLU A+B NAA
Influenza A, NAA: DETECTED — AB
Influenza B, NAA: NOT DETECTED
SARS-CoV-2, NAA: NOT DETECTED

## 2021-12-05 ENCOUNTER — Encounter (HOSPITAL_COMMUNITY): Payer: Self-pay

## 2021-12-05 ENCOUNTER — Ambulatory Visit (HOSPITAL_COMMUNITY)
Admission: EM | Admit: 2021-12-05 | Discharge: 2021-12-05 | Disposition: A | Discharge status: Home / Self Care | Payer: Self-pay | Attending: Internal Medicine | Admitting: Internal Medicine

## 2021-12-05 DIAGNOSIS — H65192 Other acute nonsuppurative otitis media, left ear: Secondary | ICD-10-CM

## 2021-12-05 DIAGNOSIS — B9689 Other specified bacterial agents as the cause of diseases classified elsewhere: Secondary | ICD-10-CM

## 2021-12-05 DIAGNOSIS — H109 Unspecified conjunctivitis: Secondary | ICD-10-CM

## 2021-12-05 MED ORDER — AMOXICILLIN 875 MG PO TABS: 875.0000 mg | ORAL_TABLET | Freq: Two times a day (BID) | ORAL | 0 refills | Status: AC

## 2021-12-05 MED ORDER — ERYTHROMYCIN 5 MG/GM OP OINT: TOPICAL_OINTMENT | OPHTHALMIC | 0 refills | Status: DC

## 2021-12-05 NOTE — Discharge Instructions (Signed)
You have pinkeye which is a bacterial infection of the eyes which is being treated with antibiotic ointment.  You also have a left ear infection which is being treated with an antibiotic pills.  Please follow-up if symptoms persist or worsen. ?

## 2021-12-05 NOTE — ED Triage Notes (Signed)
Pt c/o bilateral eye itching with drainage and bilateral ear itching x4 days. ?

## 2021-12-05 NOTE — ED Provider Notes (Signed)
?[facility] ? ? ? ?CSN: [id number] ?Arrival date & time: 12/05/21  1141 ? ? ?  ? ?History   ?Chief Complaint ?Chief Complaint  ?Patient presents with  ? Eye Drainage  ? ? ?HPI ?Bethany Diaz is a 61 y.o. female.  ? ?Patient presents with bilateral eye itching, irritation, drainage as well as ear itching that has been present for approximately 4 days.  She also has associated runny nose and cough but denies any known fevers.  Her daughter in the room reports that she has been exposed to some family members with similar symptoms.  Patient denies any blurry vision and does not wear contacts.  Denies trauma or foreign body to the eyes.  Patient denies ear pain, drainage from the ears, decreased hearing from the ears. ? ? ? ?Past Medical History:  ?Diagnosis Date  ? Diabetes mellitus without complication (HCC)   ? ? ?Patient Active Problem List  ? Diagnosis Date Noted  ? Acute calculous cholecystitis 08/07/2020  ? Non-English speaking patient ([place] Creole preferred) 08/07/2020  ? LFT elevation 08/07/2020  ? Recommendation refused by patient 08/07/2020  ? Sepsis (HCC) 08/07/2020  ? Hyponatremia 08/07/2020  ? Hyperlipidemia 08/07/2020  ? Type 2 diabetes mellitus without complication, without long-term current use of insulin (HCC) 09/07/2018  ? ? ?Past Surgical History:  ?Procedure Laterality Date  ? CHOLECYSTECTOMY N/A 08/08/2020  ? Procedure: LAPAROSCOPIC CHOLECYSTECTOMY;  Surgeon: Quentin Ore, MD;  Location: [facility];  Service: General;  Laterality: N/A;  ? NO PAST SURGERIES    ? ? ?OB History   ?No obstetric history on file. ?  ? ? ? ?Home Medications   ? ?Prior to Admission medications   ?Medication Sig Start Date End Date Taking? Authorizing Provider  ?amoxicillin (AMOXIL) 875 MG tablet Take 1 tablet (875 mg total) by mouth 2 (two) times daily for 10 days. 12/05/21 12/15/21 Yes Gustavus Bryant, FNP  ?erythromycin ophthalmic ointment Place a 1/2 inch ribbon of ointment into the lower eyelid 4 times daily  for 7 days. 12/05/21  Yes Gustavus Bryant, FNP  ? ? ?Family History ?Family History  ?Problem Relation Age of Onset  ? Diabetes Neg Hx   ? ? ?Social History ?Social History  ? ?Tobacco Use  ? Smoking status: Never  ? Smokeless tobacco: Never  ?Vaping Use  ? Vaping Use: Never used  ?Substance Use Topics  ? Alcohol use: Never  ? Drug use: Never  ? ? ? ?Allergies   ?Other ? ? ?Review of Systems ?Review of Systems ?Per HPI ? ?Physical Exam ?Triage Vital Signs ?ED Triage Vitals [12/05/21 1240]  ?Enc Vitals Group  ?   BP 135/81  ?   Pulse Rate 60  ?   Resp 18  ?   Temp 98.5 ?F (36.9 ?C)  ?   Temp Source Oral  ?   SpO2 98 %  ?   Weight   ?   Height   ?   Head Circumference   ?   Peak Flow   ?   Pain Score 0  ?   Pain Loc   ?   Pain Edu?   ?   Excl. in GC?   ? ?No data found. ? ?Updated Vital Signs ?BP 135/81 (BP Location: Left Arm)   Pulse 60   Temp 98.5 ?F (36.9 ?C) (Oral)   Resp 18   SpO2 98%  ? ?Visual Acuity ?Right Eye Distance:   ?Left Eye Distance:   ?  Bilateral Distance:   ? ?Right Eye Near:   ?Left Eye Near:    ?Bilateral Near:    ? ?Physical Exam ?Constitutional:   ?   General: She is not in acute distress. ?   Appearance: Normal appearance. She is not toxic-appearing or diaphoretic.  ?HENT:  ?   Head: Normocephalic and atraumatic.  ?   Right Ear: Ear canal normal. No drainage, swelling or tenderness. A middle ear effusion is present. No mastoid tenderness. Tympanic membrane is not perforated, erythematous or bulging.  ?   Left Ear: Ear canal normal. No drainage, swelling or tenderness.  No middle ear effusion. Tympanic membrane is erythematous. Tympanic membrane is not perforated or bulging.  ?   Nose: Congestion present.  ?   Mouth/Throat:  ?   Mouth: Mucous membranes are moist.  ?   Pharynx: No posterior oropharyngeal erythema.  ?Eyes:  ?   General: Lids are normal. Lids are everted, no foreign bodies appreciated. Vision grossly intact. Gaze aligned appropriately.  ?   Extraocular Movements: Extraocular  movements intact.  ?   Conjunctiva/sclera:  ?   Right eye: Right conjunctiva is injected. No chemosis, exudate or hemorrhage. ?   Left eye: Left conjunctiva is injected. No chemosis, exudate or hemorrhage. ?   Pupils: Pupils are equal, round, and reactive to light.  ?Cardiovascular:  ?   Rate and Rhythm: Normal rate and regular rhythm.  ?   Pulses: Normal pulses.  ?   Heart sounds: Normal heart sounds.  ?Pulmonary:  ?   Effort: Pulmonary effort is normal. No respiratory distress.  ?   Breath sounds: Normal breath sounds. No stridor. No wheezing, rhonchi or rales.  ?Abdominal:  ?   General: Abdomen is flat. Bowel sounds are normal.  ?   Palpations: Abdomen is soft.  ?Musculoskeletal:     ?   General: Normal range of motion.  ?   Cervical back: Normal range of motion.  ?Skin: ?   General: Skin is warm and dry.  ?Neurological:  ?   General: No focal deficit present.  ?   Mental Status: She is alert and oriented to person, place, and time. Mental status is at baseline.  ?Psychiatric:     ?   Mood and Affect: Mood normal.     ?   Behavior: Behavior normal.  ? ? ? ?UC Treatments / Results  ?Labs ?(all labs ordered are listed, but only abnormal results are displayed) ?Labs Reviewed - No data to display ? ?EKG ? ? ?Radiology ?No results found. ? ?Procedures ?Procedures (including critical care time) ? ?Medications Ordered in UC ?Medications - No data to display ? ?Initial Impression / Assessment and Plan / UC Course  ?I have reviewed the triage vital signs and the nursing notes. ? ?Pertinent labs & imaging results that were available during my care of the patient were reviewed by me and considered in my medical decision making (see chart for details). ? ?  ? ?Patient presents with symptoms likely from a viral upper respiratory infection. Differential includes bacterial pneumonia, sinusitis, allergic rhinitis, COVID-19, flu. Do not suspect underlying cardiopulmonary process. Symptoms seem unlikely related to ACS, CHF or COPD  exacerbations, pneumonia, pneumothorax. Patient is nontoxic appearing and not in need of emergent medical intervention. ? ?Will treat bilateral bacterial conjunctivitis with erythromycin ointment.  Advised patient to follow-up with eye doctor if symptoms persist or worsen. ? ?Recommended symptom control with over the counter medications for viral symptoms.  Amoxicillin to  treat left ear infection. ? ?Return if symptoms fail to improve in 1-2 weeks or you develop shortness of breath, chest pain, severe headache. Patient states understanding and is agreeable. ? ?Discharged with PCP followup.  ?Final Clinical Impressions(s) / UC Diagnoses  ? ?Final diagnoses:  ?Other non-recurrent acute nonsuppurative otitis media of left ear  ?Bacterial conjunctivitis of both eyes  ? ? ? ?Discharge Instructions   ? ?  ?You have pinkeye which is a bacterial infection of the eyes which is being treated with antibiotic ointment.  You also have a left ear infection which is being treated with an antibiotic pills.  Please follow-up if symptoms persist or worsen. ? ? ? ?ED Prescriptions   ? ? Medication Sig Dispense Auth. Provider  ? erythromycin ophthalmic ointment Place a 1/2 inch ribbon of ointment into the lower eyelid 4 times daily for 7 days. 3.5 g Ervin Knack E, [place]  ? amoxicillin (AMOXIL) 875 MG tablet Take 1 tablet (875 mg total) by mouth 2 (two) times daily for 10 days. 20 tablet Ervin Knack E, [place]  ? ?  ? ?PDMP not reviewed this encounter. ?  ?Gustavus Bryant, [place] ?12/05/21 1304 ? ?

## 2022-03-24 IMAGING — US US ABDOMEN LIMITED
1 series · 14 of 25 positions shown · non-contrast
Comparison: None.

CLINICAL DATA: Right upper quadrant abdominal pain.

EXAM:
ULTRASOUND ABDOMEN LIMITED RIGHT UPPER QUADRANT

[Series 1: us abdomen limited · 14 of 35 slices shown]
[im 1/35]
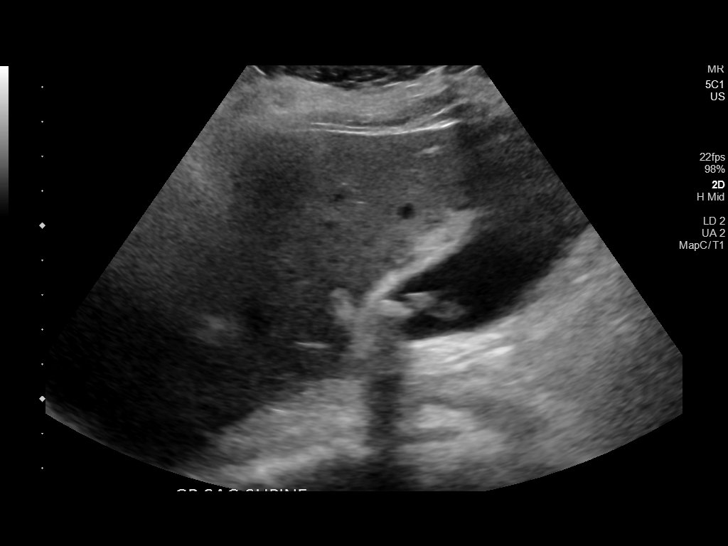
[im 3/35]
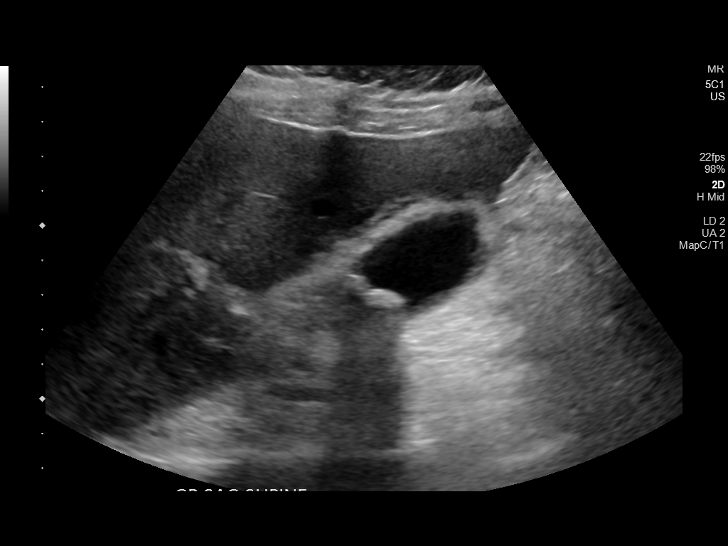
[im 6/35]
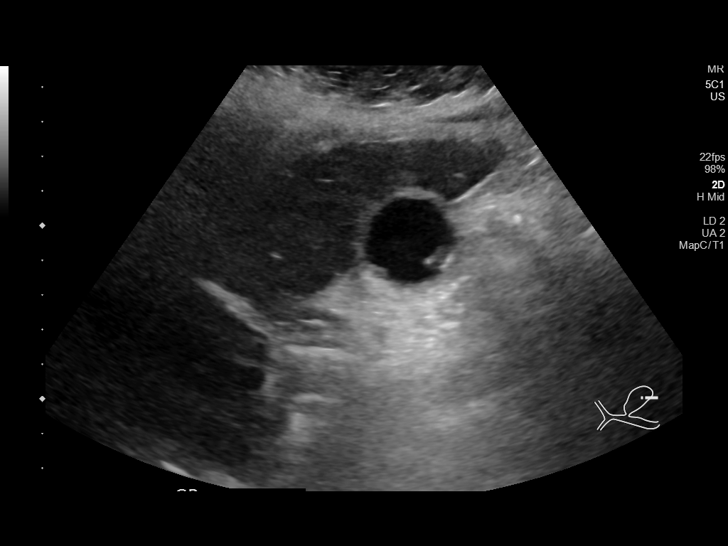
[im 9/35]
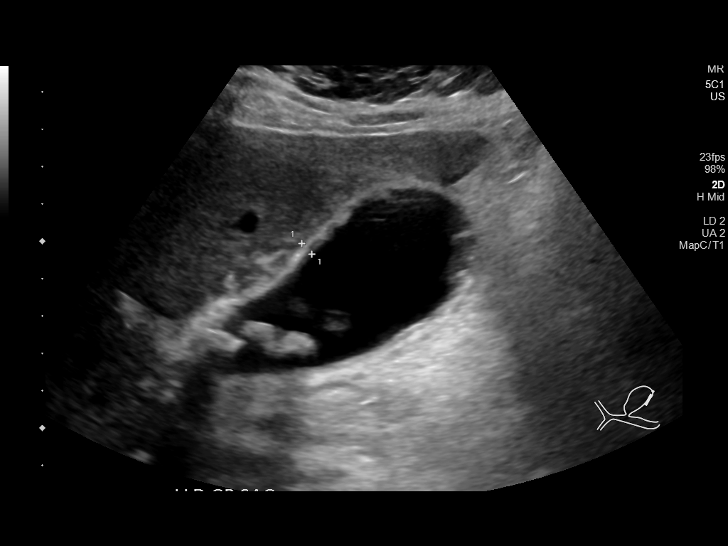
[im 12/35]
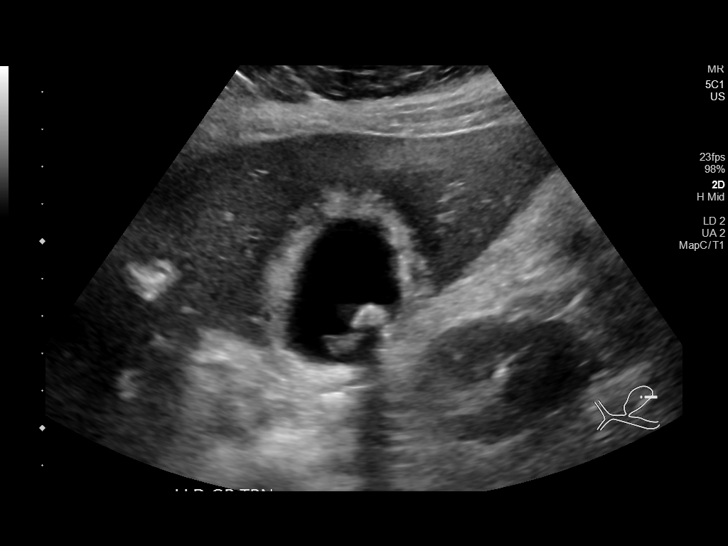
[im 13/35]
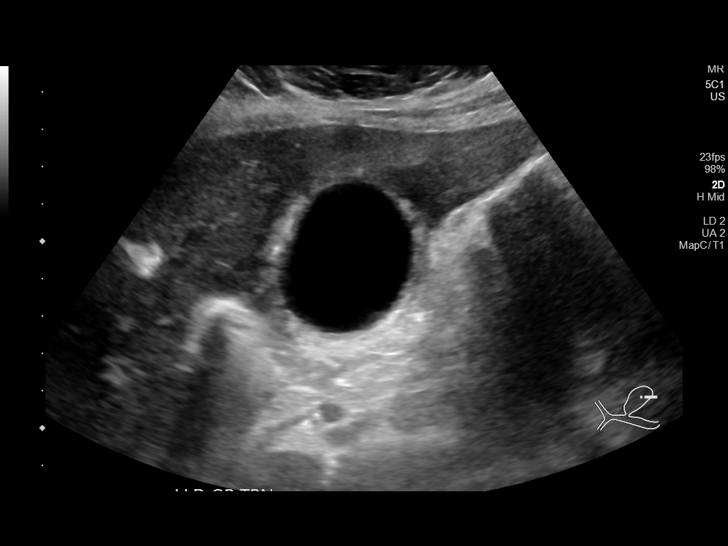
[im 16/35]
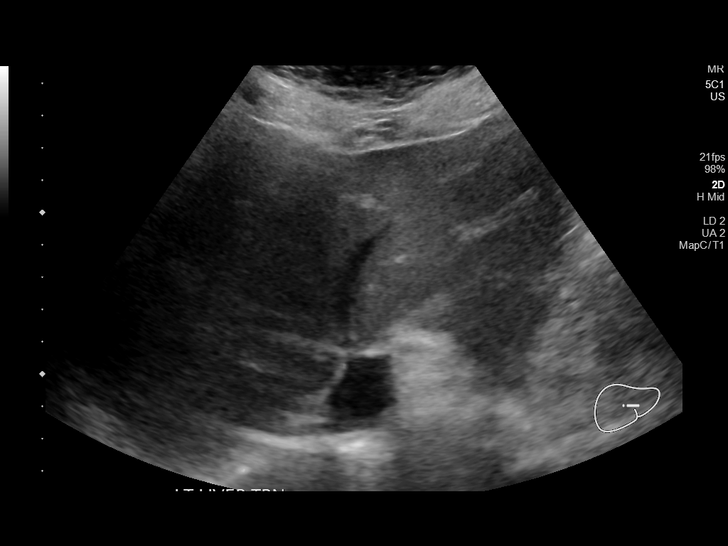
[im 19/35]
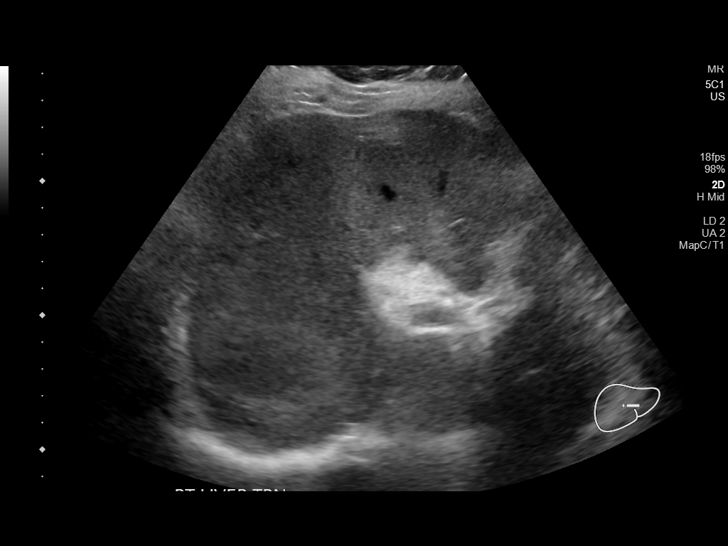
[im 22/35]
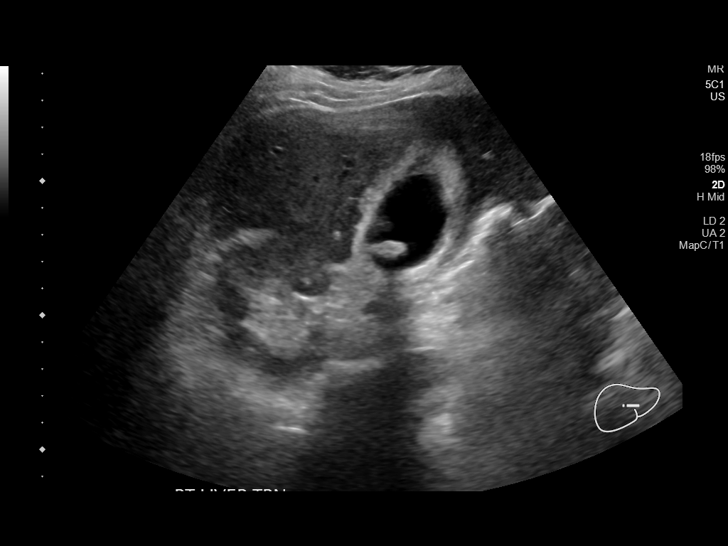
[im 23/35]
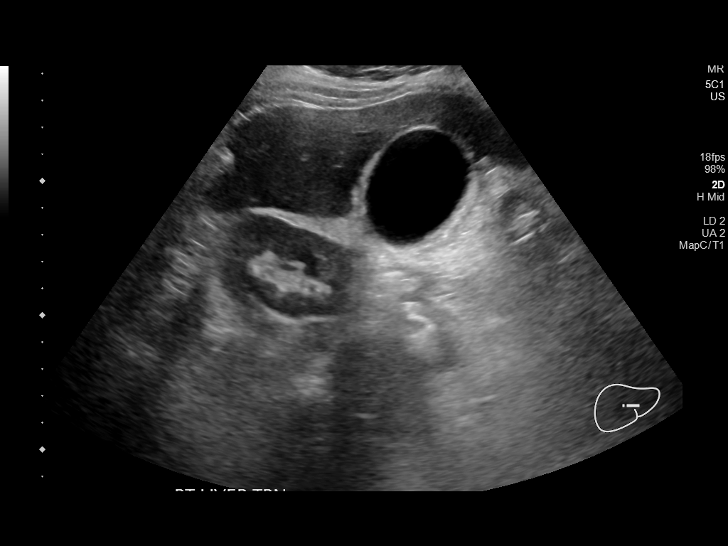
[im 26/35]
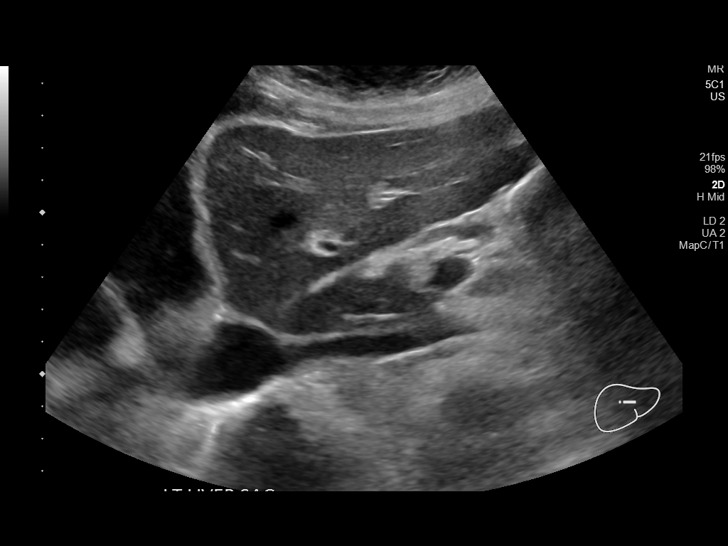
[im 29/35]
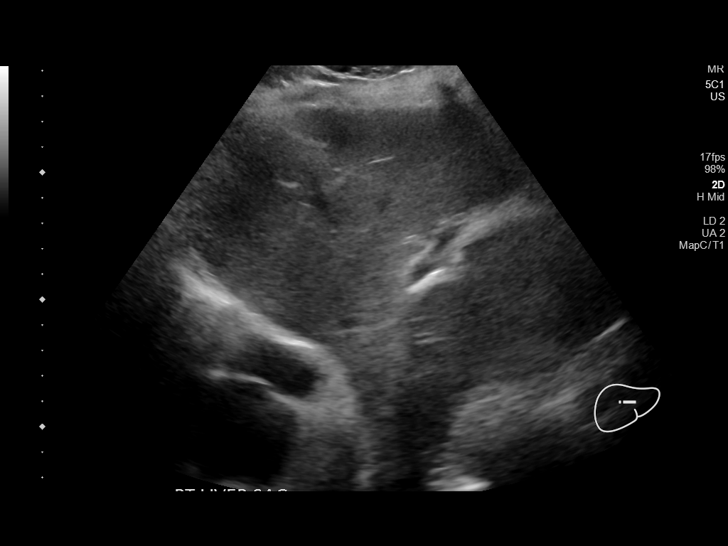
[im 32/35]
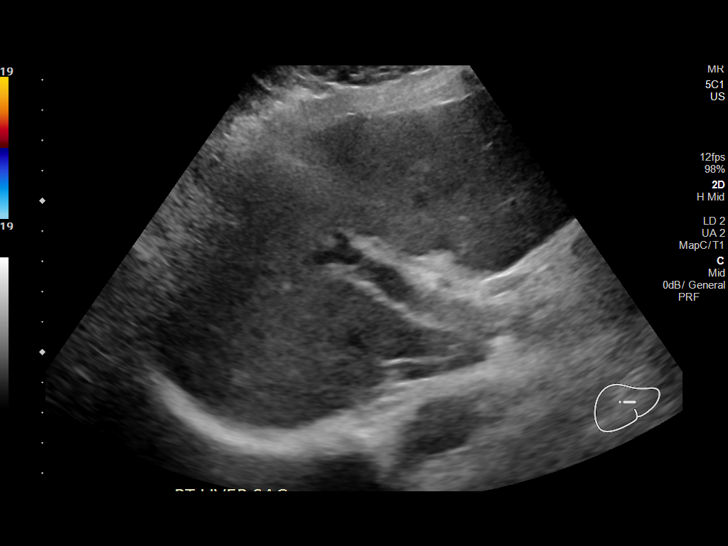
[im 35/35]
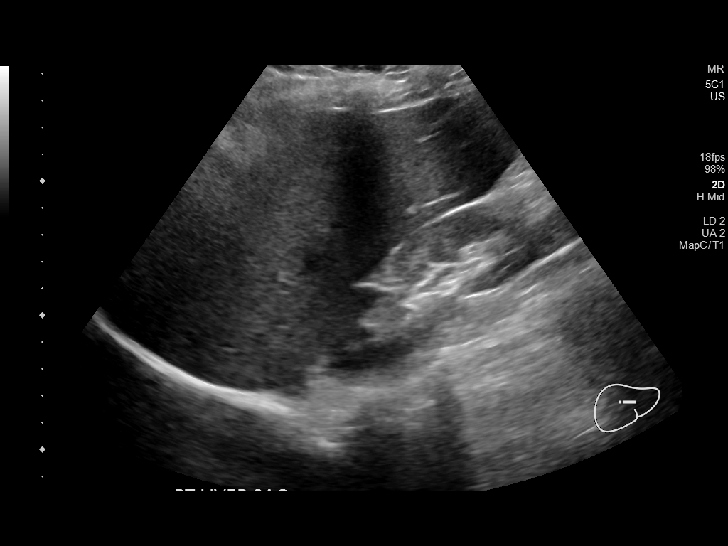

[14 of 25 positions shown; findings below may reference images not displayed]

FINDINGS: Gallbladder:

Gallbladder wall thickening. Positive Murphy sign per the
technologist. Multiple gallstones, measuring up to 1.2 cm. There may
be a small amount of pericholecystic fluid.

Common bile duct:

Diameter: 2.7 mm, within normal limits.

Liver:

No focal lesion identified. Within normal limits in parenchymal
echogenicity. Portal vein is patent on color Doppler imaging with
normal direction of blood flow towards the liver.
IMPRESSION: Findings concerning for acute cholecystitis, including gallbladder
wall thickening, gallstones, and positive Murphy sign.

These results will be called to the ordering clinician or
representative by the Radiologist Assistant, and communication
documented in the PACS or [REDACTED].

## 2022-03-24 IMAGING — DX DG CHEST 1V PORT
1 series · 1 of 1 positions shown · non-contrast
Comparison: None.

CLINICAL DATA: Questionable sepsis, evaluate for abnormality.

EXAM:
PORTABLE CHEST 1 VIEW

[chest ap]
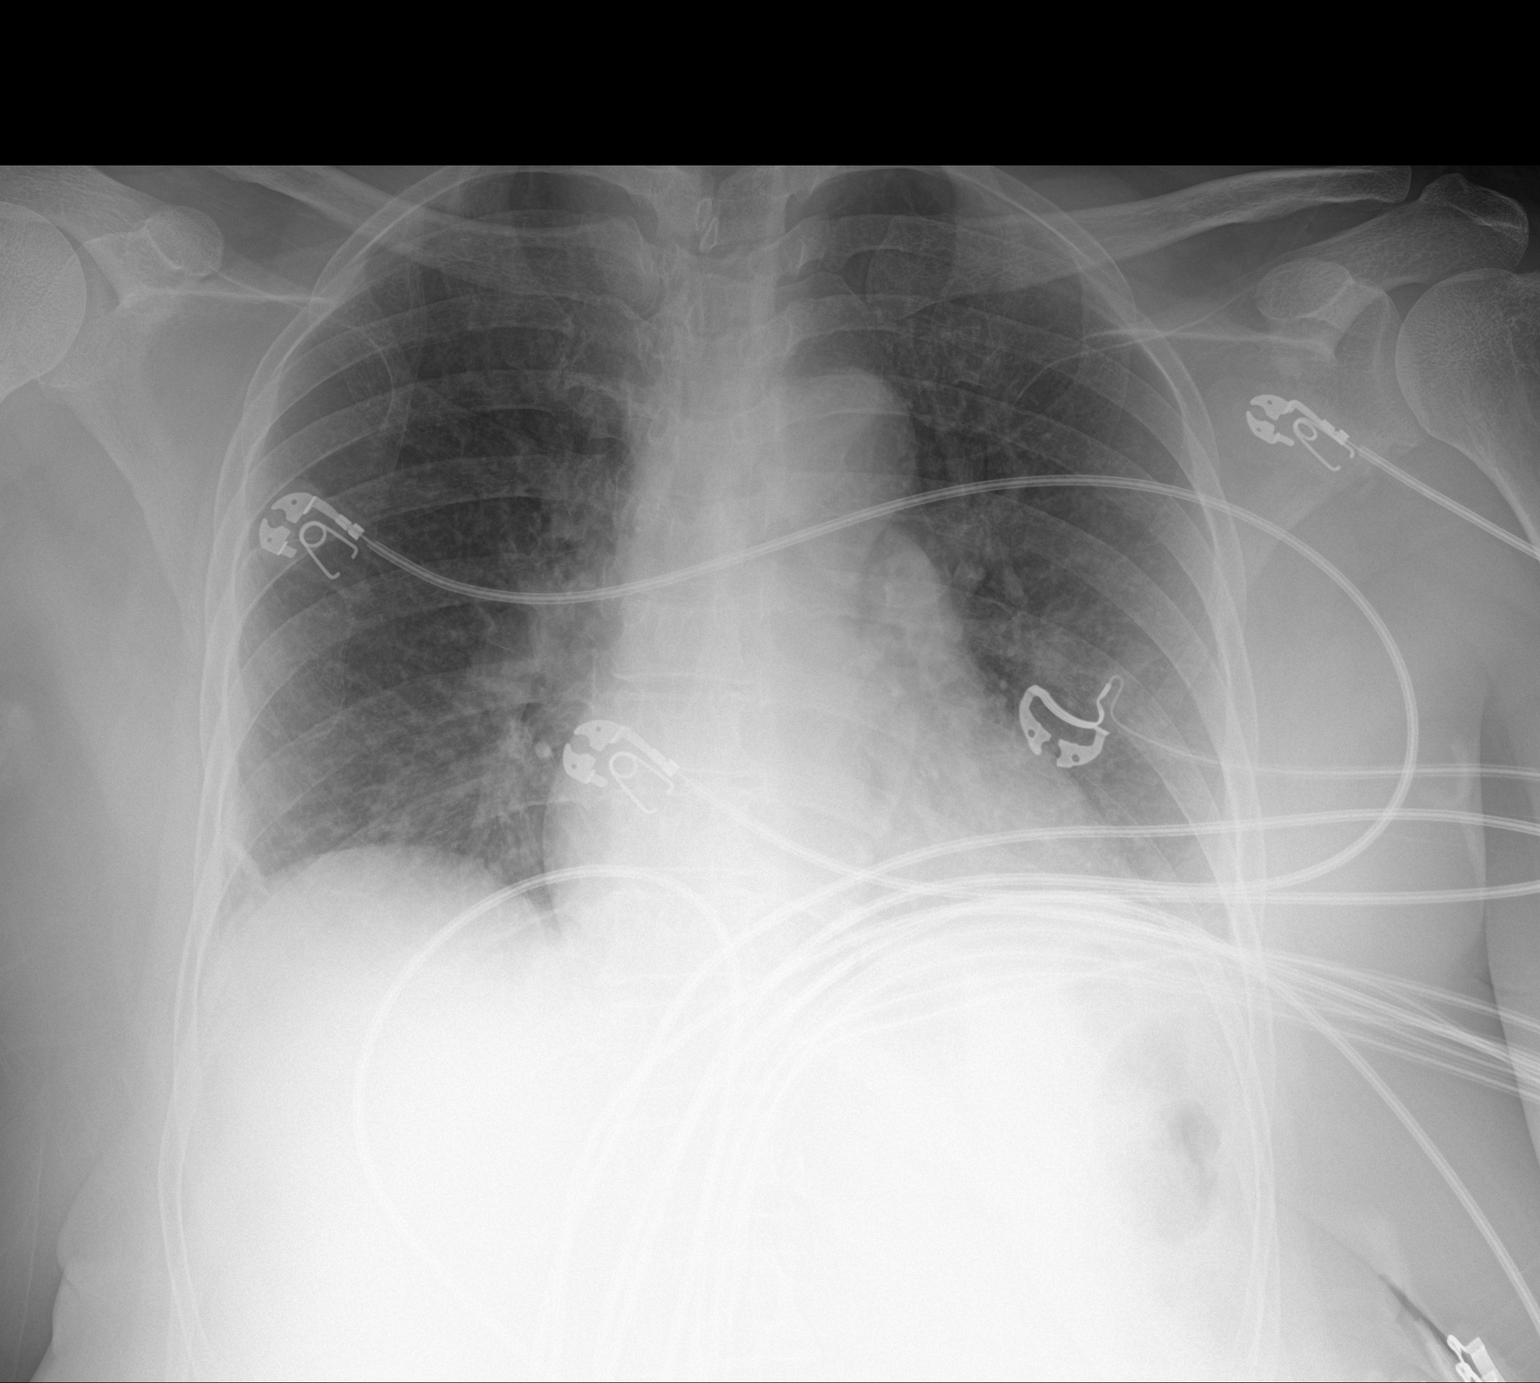

[1 of 1 positions shown; findings below may reference images not displayed]

FINDINGS: Borderline enlargement the cardiac silhouette. Linear opacity at
the lateral right lung base. Otherwise, no consolidation. No visible
pleural effusions or pneumothorax. No acute osseous abnormality.
IMPRESSION: Linear opacity at the lateral right lung base, favor atelectasis.

## 2022-12-03 ENCOUNTER — Ambulatory Visit: Payer: Self-pay

## 2022-12-03 ENCOUNTER — Ambulatory Visit
Admission: EM | Admit: 2022-12-03 | Discharge: 2022-12-03 | Disposition: A | Discharge status: Home / Self Care | Payer: Medicaid Other | Attending: Internal Medicine | Admitting: Internal Medicine

## 2022-12-03 DIAGNOSIS — H109 Unspecified conjunctivitis: Secondary | ICD-10-CM | POA: Diagnosis not present

## 2022-12-03 DIAGNOSIS — J069 Acute upper respiratory infection, unspecified: Secondary | ICD-10-CM | POA: Diagnosis not present

## 2022-12-03 DIAGNOSIS — B9689 Other specified bacterial agents as the cause of diseases classified elsewhere: Secondary | ICD-10-CM | POA: Diagnosis not present

## 2022-12-03 MED ORDER — BENZONATATE 100 MG PO CAPS: 100.0000 mg | ORAL_CAPSULE | Freq: Three times a day (TID) | ORAL | 0 refills | Status: AC | PRN

## 2022-12-03 MED ORDER — ERYTHROMYCIN 5 MG/GM OP OINT
TOPICAL_OINTMENT | OPHTHALMIC | 0 refills | Status: AC
Start: 2022-12-03 — End: ?

## 2022-12-03 MED ORDER — FLUTICASONE PROPIONATE 50 MCG/ACT NA SUSP: 1.0000 | Freq: Every day | NASAL | 0 refills | Status: AC

## 2022-12-03 NOTE — ED Triage Notes (Signed)
Pt presents with bilateral eye drainage and irritation; pt also complains of productive cough and nasal drainage X 3 days.

## 2022-12-03 NOTE — Discharge Instructions (Signed)
It appears that you have a viral upper respiratory infection that should run its course and self resolve with the help of symptomatic treatment as we discussed.  I have prescribed 2 medications to alleviate symptoms.  It also appears that you have pinkeye which is a bacterial infection of the eyes which is being treated with a topical medication that go into the eyes.  Follow-up if any symptoms persist or worsen

## 2022-12-03 NOTE — ED Provider Notes (Signed)
[facility]    CSN: [id number] Arrival date & time: 12/03/22  1516      History   Chief Complaint Chief Complaint  Patient presents with   URI   Conjunctivitis    HPI Bethany Diaz is a 62 y.o. female.   Patient presents today with bilateral eye irritation and drainage, cough, nasal drainage that all started about 3 days ago.  Patient denies any known sick contacts or fever.  Has taken Tylenol for symptoms.  Denies chest pain, shortness of breath, gastrointestinal symptoms.  Denies trauma or foreign body to the eyes.  Patient does not wear any corrective lenses.  Patient's family member at bedside reports that she went to [place] today and had RSV, pneumonia, Tdap vaccine to "boost her immune system".   URI Conjunctivitis    Past Medical History:  Diagnosis Date   Diabetes mellitus without complication     Patient Active Problem List   Diagnosis Date Noted   Acute calculous cholecystitis 08/07/2020   Non-English speaking patient ([place] Creole preferred) 08/07/2020   LFT elevation 08/07/2020   Recommendation refused by patient 08/07/2020   Sepsis 08/07/2020   Hyponatremia 08/07/2020   Hyperlipidemia 08/07/2020   Type 2 diabetes mellitus without complication, without long-term current use of insulin 09/07/2018    Past Surgical History:  Procedure Laterality Date   CHOLECYSTECTOMY N/A 08/08/2020   Procedure: LAPAROSCOPIC CHOLECYSTECTOMY;  Surgeon: Felicie Morn, MD;  Location: [facility];  Service: General;  Laterality: N/A;   NO PAST SURGERIES      OB History   No obstetric history on file.      Home Medications    Prior to Admission medications   Medication Sig Start Date End Date Taking? Authorizing Provider  benzonatate (TESSALON) 100 MG capsule Take 1 capsule (100 mg total) by mouth every 8 (eight) hours as needed for cough. 12/03/22  Yes Hyland Mollenkopf, Hildred Alamin E, FNP  erythromycin ophthalmic ointment Place a 1/2 inch ribbon of ointment  into the lower eyelid 4 times daily for 7 days. 12/03/22  Yes Beverely Suen, Hildred Alamin E, FNP  fluticasone (FLONASE) 50 MCG/ACT nasal spray Place 1 spray into both nostrils daily. 12/03/22  Yes Alijah Hyde, Michele Rockers, FNP    Family History Family History  Problem Relation Age of Onset   Diabetes Neg Hx     Social History Social History   Tobacco Use   Smoking status: Never   Smokeless tobacco: Never  Vaping Use   Vaping Use: Never used  Substance Use Topics   Alcohol use: Never   Drug use: Never     Allergies   Other   Review of Systems Review of Systems Per HPI  Physical Exam Triage Vital Signs ED Triage Vitals  Enc Vitals Group     BP 12/03/22 1526 123/83     Pulse Rate 12/03/22 1526 95     Resp 12/03/22 1526 17     Temp 12/03/22 1526 (!) 97.4 F (36.3 C)     Temp Source 12/03/22 1526 Oral     SpO2 12/03/22 1526 93 %     Weight --      Height --      Head Circumference --      Peak Flow --      Pain Score 12/03/22 1525 3     Pain Loc --      Pain Edu? --      Excl. in [place]? --    No data found.  Updated  Vital Signs BP 123/83 (BP Location: Left Arm)   Pulse 95   Temp (!) 97.4 F (36.3 C) (Oral)   Resp 17   SpO2 96%   Visual Acuity Right Eye Distance:   Left Eye Distance:   Bilateral Distance:    Right Eye Near:   Left Eye Near:    Bilateral Near:     Physical Exam Constitutional:      General: She is not in acute distress.    Appearance: Normal appearance. She is not toxic-appearing or diaphoretic.  HENT:     Head: Normocephalic and atraumatic.     Right Ear: Tympanic membrane and ear canal normal.     Left Ear: Tympanic membrane and ear canal normal.     Nose: Congestion present.     Mouth/Throat:     Mouth: Mucous membranes are moist.     Pharynx: No posterior oropharyngeal erythema.  Eyes:     General: Lids are normal. Lids are everted, no foreign bodies appreciated. Vision grossly intact. Gaze aligned appropriately.     Extraocular Movements:  Extraocular movements intact.     Conjunctiva/sclera:     Right eye: Right conjunctiva is injected. No chemosis, exudate or hemorrhage.    Left eye: Left conjunctiva is injected. No chemosis, exudate or hemorrhage.    Pupils: Pupils are equal, round, and reactive to light.     Comments: Scleral redness throughout.  Cardiovascular:     Rate and Rhythm: Normal rate and regular rhythm.     Pulses: Normal pulses.     Heart sounds: Normal heart sounds.  Pulmonary:     Effort: Pulmonary effort is normal. No respiratory distress.     Breath sounds: Normal breath sounds. No stridor. No wheezing, rhonchi or rales.  Abdominal:     General: Abdomen is flat. Bowel sounds are normal.     Palpations: Abdomen is soft.  Musculoskeletal:        General: Normal range of motion.     Cervical back: Normal range of motion.  Skin:    General: Skin is warm and dry.  Neurological:     General: No focal deficit present.     Mental Status: She is alert and oriented to person, place, and time. Mental status is at baseline.  Psychiatric:        Mood and Affect: Mood normal.        Behavior: Behavior normal.      UC Treatments / Results  Labs (all labs ordered are listed, but only abnormal results are displayed) Labs Reviewed - No data to display  EKG   Radiology No results found.  Procedures Procedures (including critical care time)  Medications Ordered in UC Medications - No data to display  Initial Impression / Assessment and Plan / UC Course  I have reviewed the triage vital signs and the nursing notes.  Pertinent labs & imaging results that were available during my care of the patient were reviewed by me and considered in my medical decision making (see chart for details).     Patient presents with symptoms likely from a viral upper respiratory infection. Do not suspect underlying cardiopulmonary process. Symptoms seem unlikely related to ACS, CHF or COPD exacerbations, pneumonia,  pneumothorax. Patient is nontoxic appearing and not in need of emergent medical intervention.  Patient and family member declined COVID testing.  Recommended symptom control with medications and supportive care.  Patient was sent prescriptions for cough and nasal drainage.  Erythromycin to treat  bilateral bacterial conjunctivitis.  Visual acuity appears normal.  Return if symptoms fail to improve in 1-2 weeks or you develop shortness of breath, chest pain, severe headache. Patient states understanding and is agreeable.  Discussed with family member the risks of having vaccines while being sick and to not do this in the future.  Advised to monitor symptoms and fever closely due to the adverse effects of this.  Discharged with PCP followup.  Patient declined interpreter and patient wished for her family member to interpret. Final Clinical Impressions(s) / UC Diagnoses   Final diagnoses:  Bacterial conjunctivitis of both eyes  Viral upper respiratory tract infection with cough     Discharge Instructions      It appears that you have a viral upper respiratory infection that should run its course and self resolve with the help of symptomatic treatment as we discussed.  I have prescribed 2 medications to alleviate symptoms.  It also appears that you have pinkeye which is a bacterial infection of the eyes which is being treated with a topical medication that go into the eyes.  Follow-up if any symptoms persist or worsen    ED Prescriptions     Medication Sig Dispense Auth. Provider   benzonatate (TESSALON) 100 MG capsule Take 1 capsule (100 mg total) by mouth every 8 (eight) hours as needed for cough. 21 capsule [place], [place] E, [place]   fluticasone [facility]) 50 MCG/ACT nasal spray Place 1 spray into both nostrils daily. 16 g Oswaldo Conroy E, [place]   erythromycin ophthalmic ointment Place a 1/2 inch ribbon of ointment into the lower eyelid 4 times daily for 7 days. 3.5 g Teodora Medici, [place]       PDMP not reviewed this encounter.   Teodora Medici, Pen Mar 12/03/22 [phone]
